# Patient Record
Sex: Female | Born: 1995 | Race: Black or African American | Hispanic: No | Marital: Single | Smoking: Never smoker
Health system: Southern US, Community
[De-identification: ages and names within clinical notes are randomized; demographics above are authoritative.]

## PROBLEM LIST (undated history)

## (undated) DIAGNOSIS — J45909 Unspecified asthma, uncomplicated: Secondary | ICD-10-CM

## (undated) HISTORY — PX: NO PAST SURGERIES: SHX2092

---
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
## 2017-02-10 ENCOUNTER — Encounter (HOSPITAL_COMMUNITY): Payer: Self-pay | Admitting: Emergency Medicine

## 2017-02-10 ENCOUNTER — Emergency Department (HOSPITAL_COMMUNITY): Payer: Self-pay

## 2017-02-10 ENCOUNTER — Emergency Department (HOSPITAL_COMMUNITY)
Admission: EM | Admit: 2017-02-10 | Discharge: 2017-02-10 | Disposition: A | Discharge status: Home / Self Care | Payer: Self-pay | Attending: Emergency Medicine | Admitting: Emergency Medicine

## 2017-02-10 DIAGNOSIS — B9789 Other viral agents as the cause of diseases classified elsewhere: Secondary | ICD-10-CM | POA: Insufficient documentation

## 2017-02-10 DIAGNOSIS — J45901 Unspecified asthma with (acute) exacerbation: Secondary | ICD-10-CM | POA: Insufficient documentation

## 2017-02-10 DIAGNOSIS — Z7722 Contact with and (suspected) exposure to environmental tobacco smoke (acute) (chronic): Secondary | ICD-10-CM | POA: Insufficient documentation

## 2017-02-10 DIAGNOSIS — J069 Acute upper respiratory infection, unspecified: Secondary | ICD-10-CM | POA: Insufficient documentation

## 2017-02-10 DIAGNOSIS — Z79899 Other long term (current) drug therapy: Secondary | ICD-10-CM | POA: Insufficient documentation

## 2017-02-10 DIAGNOSIS — R05 Cough: Secondary | ICD-10-CM | POA: Insufficient documentation

## 2017-02-10 MED ORDER — ALBUTEROL SULFATE (2.5 MG/3ML) 0.083% IN NEBU
INHALATION_SOLUTION | RESPIRATORY_TRACT | Status: AC
Start: 2017-02-10 — End: 2017-02-10
  Administered 2017-02-10: 13:00:00
  Filled 2017-02-10: qty 6

## 2017-02-10 MED ORDER — ALBUTEROL SULFATE (2.5 MG/3ML) 0.083% IN NEBU
5.0000 mg | INHALATION_SOLUTION | Freq: Once | RESPIRATORY_TRACT | Status: AC
  Administered 2017-02-10: 5 mg via RESPIRATORY_TRACT

## 2017-02-10 MED ORDER — BENZONATATE 100 MG PO CAPS: 100.0000 mg | ORAL_CAPSULE | Freq: Three times a day (TID) | ORAL | 0 refills | Status: DC

## 2017-02-10 MED ORDER — PREDNISONE 50 MG PO TABS: 50.0000 mg | ORAL_TABLET | Freq: Every day | ORAL | 0 refills | Status: DC

## 2017-02-10 NOTE — ED Triage Notes (Signed)
Pt arrives form home reporting ongoing URI x 2 weeks, hx asthma with SOB unrelieved by ned and inhaler at home.  Resp e/u, NAD noted at this time.

## 2017-02-10 NOTE — Discharge Instructions (Signed)
Please read attached information. If you experience any new or worsening signs or symptoms please return to the emergency room for evaluation. Please follow-up with your primary care provider or specialist as discussed. Please use medication prescribed only as directed and discontinue taking if you have any concerning signs or symptoms.   °

## 2017-02-10 NOTE — ED Provider Notes (Signed)
[facility] EMERGENCY DEPARTMENT Provider Note   CSN: [id number] Arrival date & time: 02/10/17  1204     History   Chief Complaint Chief Complaint  Patient presents with  . Shortness of Breath  . URI    HPI Cindy Welch is a 21 y.o. female.  HPI   21 year old female presents today with complaints of upper respiratory infection and asthma exacerbation.  Patient reports approximately 2 weeks ago she developed rhinorrhea, congestion, cough and wheezing.  She notes a history of asthma, has been using nebulizer and inhaler at home with minor improvements of her symptoms.  Patient notes she continues to have cough, wheezing not significantly improved with breathing treatments.  Patient denies any fever throughout the entire episode.  No other complaints today. Pt reports that she is not pregnant.  Past Medical History:  Diagnosis Date  . Asthma     There are no active problems to display for this patient.   History reviewed. No pertinent surgical history.  OB History    No data available       Home Medications    Prior to Admission medications   Medication Sig Start Date End Date Taking? Authorizing Provider  benzonatate (TESSALON) 100 MG capsule Take 1 capsule (100 mg total) by mouth every 8 (eight) hours. 02/10/17   Breezy Hertenstein, Tinnie Gens, PA-C  butalbital-acetaminophen-caffeine (FIORICET, ESGIC) [phone] MG tablet Take 1-2 tablets by mouth every 6 (six) hours as needed for headache. 08/03/16 08/03/17  Dorena Bodo, NP  cetirizine (ZYRTEC) 10 MG tablet Take 10 mg by mouth daily.    [provider]  ipratropium (ATROVENT) 0.06 % nasal spray Place 2 sprays into both nostrils 4 (four) times daily. 08/03/16   Dorena Bodo, NP  predniSONE (DELTASONE) 50 MG tablet Take 1 tablet (50 mg total) by mouth daily. 02/10/17   Eyvonne Mechanic, PA-C    Family History No family history on file.  Social History Social History  Substance Use Topics    . Smoking status: Passive Smoke Exposure - Never Smoker  . Smokeless tobacco: Not on file  . Alcohol use No     Allergies   Patient has no known allergies.   Review of Systems Review of Systems  All other systems reviewed and are negative.    Physical Exam Updated Vital Signs BP 115/69 (BP Location: Right Arm)   Pulse 98   Temp 99.2 F (37.3 C) (Oral)   Resp 18   LMP 01/20/2017 (Exact Date)   SpO2 98%   Physical Exam  Constitutional: She is oriented to person, place, and time. She appears well-developed and well-nourished.  HENT:  Head: Normocephalic and atraumatic.  Rhinorrhea  Eyes: Pupils are equal, round, and reactive to light. Conjunctivae are normal. Right eye exhibits no discharge. Left eye exhibits no discharge. No scleral icterus.  Neck: Normal range of motion. No JVD present. No tracheal deviation present.  Cardiovascular: Regular rhythm, normal heart sounds and intact distal pulses.  Exam reveals no gallop and no friction rub.   No murmur heard. Pulmonary/Chest: Effort normal and breath sounds normal. No stridor. No respiratory distress. She has no wheezes. She has no rales. She exhibits no tenderness.  Neurological: She is alert and oriented to person, place, and time. Coordination normal.  Skin: Skin is warm.  Psychiatric: She has a normal mood and affect. Her behavior is normal. Judgment and thought content normal.  Nursing note and vitals reviewed.    ED Treatments / Results  Labs (  all labs ordered are listed, but only abnormal results are displayed) Labs Reviewed - No data to display  EKG  EKG Interpretation None       Radiology Dg Chest 2 View  Result Date: 02/10/2017 CLINICAL DATA:  Shortness breath. Cough for 2 weeks. History of asthma. EXAM: CHEST  2 VIEW COMPARISON:  Chest x-ray dated November 10, 2012. FINDINGS: The heart size and mediastinal contours are within normal limits. Both lungs are clear. The visualized skeletal structures are  unremarkable. IMPRESSION: Normal chest x-ray. Electronically Signed   By: Obie DredgeWilliam T Derry M.D.   On: 02/10/2017 13:10    Procedures Procedures (including critical care time)  Medications Ordered in ED Medications  albuterol (PROVENTIL) (2.5 MG/3ML) 0.083% nebulizer solution 5 mg (5 mg Nebulization Given 02/10/17 1228)  albuterol (PROVENTIL) (2.5 MG/3ML) 0.083% nebulizer solution (  Given 02/10/17 1257)     Initial Impression / Assessment and Plan / ED Course  I have reviewed the triage vital signs and the nursing notes.  Pertinent labs & imaging results that were available during my care of the patient were reviewed by me and considered in my medical decision making (see chart for details).      Final Clinical Impressions(s) / ED Diagnoses   Final diagnoses:  Mild asthma with exacerbation, unspecified whether persistent  Viral URI with cough    Labs:   Imaging: DG Chest 2 View  Consults:  Therapeutics: DuoNeb  Discharge Meds: Prednisone, Tessalon Perles  Assessment/Plan: 21 year old female presents today with likely asthma exacerbation secondary to viral upper respiratory infection.  She is afebrile nontoxic.  She has clear lung sounds after breathing treatment here.  Patient describes recurring asthma exacerbations.  She will be started on a short course of steroids, she will follow-up with her primary care if symptoms do not improve, return immediately if they worsen.  She verbalized understanding and agreement to today's plan had no further questions or concerns the time discharge.      New Prescriptions New Prescriptions   BENZONATATE (TESSALON) 100 MG CAPSULE    Take 1 capsule (100 mg total) by mouth every 8 (eight) hours.   PREDNISONE (DELTASONE) 50 MG TABLET    Take 1 tablet (50 mg total) by mouth daily.     Eyvonne MechanicHedges, Ersie Savino, PA-C 02/10/17 1328    Eyvonne MechanicHedges, Dontrez Pettis, PA-C 02/10/17 1329    Rolland PorterJames, Mark, MD 02/22/17 [phone]

## 2017-05-24 ENCOUNTER — Emergency Department (HOSPITAL_COMMUNITY)
Admission: EM | Admit: 2017-05-24 | Discharge: 2017-05-24 | Disposition: A | Discharge status: Home / Self Care | Payer: Medicaid Other | Attending: Emergency Medicine | Admitting: Emergency Medicine

## 2017-05-24 ENCOUNTER — Encounter (HOSPITAL_COMMUNITY): Payer: Self-pay | Admitting: Emergency Medicine

## 2017-05-24 ENCOUNTER — Emergency Department (HOSPITAL_COMMUNITY): Payer: Medicaid Other

## 2017-05-24 ENCOUNTER — Other Ambulatory Visit: Payer: Self-pay

## 2017-05-24 DIAGNOSIS — Z7722 Contact with and (suspected) exposure to environmental tobacco smoke (acute) (chronic): Secondary | ICD-10-CM | POA: Insufficient documentation

## 2017-05-24 DIAGNOSIS — J40 Bronchitis, not specified as acute or chronic: Secondary | ICD-10-CM | POA: Insufficient documentation

## 2017-05-24 LAB — INFLUENZA PANEL BY PCR (TYPE A & B)
INFLAPCR: NEGATIVE
INFLBPCR: NEGATIVE

## 2017-05-24 MED ORDER — ALBUTEROL SULFATE HFA 108 (90 BASE) MCG/ACT IN AERS: 2.0000 | INHALATION_SPRAY | RESPIRATORY_TRACT | 0 refills | Status: DC | PRN

## 2017-05-24 MED ORDER — PREDNISONE 20 MG PO TABS: ORAL_TABLET | ORAL | 0 refills | Status: DC

## 2017-05-24 MED ORDER — AZITHROMYCIN 250 MG PO TABS: ORAL_TABLET | ORAL | 0 refills | Status: DC

## 2017-05-24 MED ORDER — DM-GUAIFENESIN ER 30-600 MG PO TB12
1.0000 | ORAL_TABLET | Freq: Two times a day (BID) | ORAL | Status: DC
  Administered 2017-05-24: 1 via ORAL
  Filled 2017-05-24: qty 1

## 2017-05-24 NOTE — ED Notes (Signed)
C/o fever chills nausea and cough x 2 days.

## 2017-05-24 NOTE — Discharge Instructions (Signed)
Drink plenty of fluids. Take mucinex DM OTC for cough. Use your inhaler or nebulizer for wheezing as needed. Take the prednisone and zpak until gone. Recheck if you struggle to breathe despite using your inhalers.

## 2017-05-24 NOTE — ED Provider Notes (Signed)
[facility] Provider Note   CSN: [id number] Arrival date & time: 05/24/17  0222  Time seen 02:59 AM   History   Chief Complaint Chief Complaint  Patient presents with  . Flu like S/S    HPI Johna RolesMaria T Howser is a 22 y.o. female.  HPI patient reports about 2 days ago she started having with some yellow sputum production, rhinorrhea that is clear without sneezing, chills, but no documented fevers at home.  She has had nausea and vomited twice today, she denies diarrhea.  She started getting wheezing on February 9 and used her nebulizer at 1230 this morning.  She currently does not feel like she is wheezing.  She denies sore throat.  She states her chest hurts a little bit in the center after coughing.  She denies abdominal pain.  She states her tongue feels dry.  She states she feels short of breath but she does not feel like she is wheezing now.  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  PCP Dossie ArbourJennings, Jessica, MD   Past Medical History:  Diagnosis Date  . Asthma     There are no active problems to display for this patient.   History reviewed. No pertinent surgical history.  OB History    No data available       Home Medications    Prior to Admission medications   Medication Sig Start Date End Date Taking? Authorizing Provider  albuterol (PROVENTIL HFA;VENTOLIN HFA) 108 (90 Base) MCG/ACT inhaler Inhale 1-2 puffs into the lungs every 6 (six) hours as needed for wheezing or shortness of breath.   Yes [provider]  albuterol (PROVENTIL HFA;VENTOLIN HFA) 108 (90 Base) MCG/ACT inhaler Inhale 2 puffs into the lungs every 4 (four) hours as needed for wheezing or shortness of breath. 05/24/17   Devoria AlbeKnapp, Kennedi Lizardo, MD  azithromycin (ZITHROMAX Z-PAK) 250 MG tablet Take 2 po the first day then once a day for the next 4 days. 05/24/17   Devoria AlbeKnapp, Serah Nicoletti, MD  benzonatate (TESSALON) 100 MG  capsule Take 1 capsule (100 mg total) by mouth every 8 (eight) hours. Patient not taking: Reported on 05/24/2017 02/10/17   Hedges, Tinnie GensJeffrey, PA-C  butalbital-acetaminophen-caffeine (FIORICET, ESGIC) [phone] MG tablet Take 1-2 tablets by mouth every 6 (six) hours as needed for headache. Patient not taking: Reported on 05/24/2017 08/03/16 08/03/17  Dorena BodoKennard, Lawrence, NP  ipratropium (ATROVENT) 0.06 % nasal spray Place 2 sprays into both nostrils 4 (four) times daily. Patient not taking: Reported on 05/24/2017 08/03/16   Dorena BodoKennard, Lawrence, NP  predniSONE (DELTASONE) 20 MG tablet Take 3 po QD x 3d , then 2 po QD x 3d then 1 po QD x 3d 05/24/17   Devoria AlbeKnapp, Branda Chaudhary, MD    Family History No family history on file.  Social History Social History   Tobacco Use  . Smoking status: Passive Smoke Exposure - Never Smoker  . Smokeless tobacco: Never Used  Substance Use Topics  . Alcohol use: No  . Drug use: No  employed   Allergies   Patient has no known allergies.   Review of Systems Review of Systems  All other systems reviewed and are negative.    Physical Exam Updated Vital Signs BP 137/78 (BP Location: Right Arm)   Pulse (!) 118   Temp 100 F (37.8 C) (Oral)   Resp 18   Ht 5\' 4"  (1.626 m)  Wt 81.6 kg (180 lb)   SpO2 99%   BMI 30.90 kg/m   Vital signs normal except for low-grade temp and tachycardia   Physical Exam  Constitutional: She is oriented to person, place, and time. She appears well-developed and well-nourished.  Non-toxic appearance. She does not appear ill. No distress.  Looks like she feels bad  HENT:  Head: Normocephalic and atraumatic.  Right Ear: External ear normal.  Left Ear: External ear normal.  Nose: Nose normal. No mucosal edema or rhinorrhea.  Mouth/Throat: Oropharynx is clear and moist and mucous membranes are normal. No dental abscesses or uvula swelling.  Eyes: Conjunctivae and EOM are normal. Pupils are equal, round, and reactive to light.  Neck: Normal  range of motion and full passive range of motion without pain. Neck supple.  Cardiovascular: Normal rate, regular rhythm and normal heart sounds. Exam reveals no gallop and no friction rub.  No murmur heard. Pulmonary/Chest: Effort normal and breath sounds normal. No respiratory distress. She has no wheezes. She has no rhonchi. She has no rales. She exhibits no tenderness and no crepitus.  Abdominal: Soft. Normal appearance and bowel sounds are normal. She exhibits no distension. There is no tenderness. There is no rebound and no guarding.  Musculoskeletal: Normal range of motion. She exhibits no edema or tenderness.  Moves all extremities well.   Neurological: She is alert and oriented to person, place, and time. She has normal strength. No cranial nerve deficit.  Skin: Skin is warm, dry and intact. No rash noted. No erythema. No pallor.  Psychiatric: She has a normal mood and affect. Her speech is normal and behavior is normal. Her mood appears not anxious.  Nursing note and vitals reviewed.    ED Treatments / Results  Labs (all labs ordered are listed, but only abnormal results are displayed) Results for orders placed or performed during the hospital encounter of 05/24/17  Influenza panel by PCR (type A & B)  Result Value Ref Range   Influenza A By PCR NEGATIVE NEGATIVE   Influenza B By PCR NEGATIVE NEGATIVE   Laboratory interpretation all normal    EKG  EKG Interpretation None       Radiology Dg Chest 2 View  Result Date: 05/24/2017 CLINICAL DATA:  Flu like symptoms x2 days. EXAM: CHEST  2 VIEW COMPARISON:  02/10/2017 FINDINGS: The heart size and mediastinal contours are within normal limits. Both lungs are clear. The visualized skeletal structures are unremarkable. IMPRESSION: No active cardiopulmonary disease. Electronically Signed   By: Tollie Eth M.D.   On: 05/24/2017 03:07    Procedures Procedures (including critical care time)  Medications Ordered in  ED Medications  dextromethorphan-guaiFENesin (MUCINEX DM) 30-600 MG per 12 hr tablet 1 tablet (1 tablet Oral Given 05/24/17 0407)     Initial Impression / Assessment and Plan / ED Course  I have reviewed the triage vital signs and the nursing notes.  Pertinent labs & imaging results that were available during my care of the patient were reviewed by me and considered in my medical decision making (see chart for details).    Influenza testing was done.  At the time of my exam patient was not wheezing.  She was given Mucinex for her cough.  Recheck at 4:45 AM at time of discharge.  I gave the test results to patient and her mother.  When I listen to her lungs she has good air movement and she is not wheezing.  We discussed that she normally  does get put on steroids when she has an asthma flareup.  She can take Mucinex DM over-the-counter for cough.  I am going to put her on antibiotic due to her underlying asthma and her fever.  Even though her chest x-ray did not show pneumonia she has probably a bronchitis and asthma flare.   Final Clinical Impressions(s) / ED Diagnoses   Final diagnoses:  Bronchitis    ED Discharge Orders        Ordered    albuterol (PROVENTIL HFA;VENTOLIN HFA) 108 (90 Base) MCG/ACT inhaler  Every 4 hours PRN     05/24/17 0511    predniSONE (DELTASONE) 20 MG tablet     05/24/17 0511    azithromycin (ZITHROMAX Z-PAK) 250 MG tablet     05/24/17 6045      Plan discharge  Devoria Albe, MD, Concha Pyo, MD 05/24/17 [phone]

## 2017-05-24 NOTE — ED Triage Notes (Signed)
Pt reports flu like S/S X2 days. Cough, sore throat, fever/chills, body aches, emesis. Pt has not tried any OTC meds.

## 2018-05-05 ENCOUNTER — Encounter (HOSPITAL_COMMUNITY): Payer: Self-pay | Admitting: Emergency Medicine

## 2018-05-05 ENCOUNTER — Emergency Department (HOSPITAL_COMMUNITY)
Admission: EM | Admit: 2018-05-05 | Discharge: 2018-05-05 | Disposition: A | Discharge status: Home / Self Care | Payer: Self-pay | Attending: Emergency Medicine | Admitting: Emergency Medicine

## 2018-05-05 DIAGNOSIS — J45909 Unspecified asthma, uncomplicated: Secondary | ICD-10-CM | POA: Insufficient documentation

## 2018-05-05 DIAGNOSIS — R112 Nausea with vomiting, unspecified: Secondary | ICD-10-CM | POA: Insufficient documentation

## 2018-05-05 DIAGNOSIS — Z79899 Other long term (current) drug therapy: Secondary | ICD-10-CM | POA: Insufficient documentation

## 2018-05-05 DIAGNOSIS — R197 Diarrhea, unspecified: Secondary | ICD-10-CM | POA: Insufficient documentation

## 2018-05-05 DIAGNOSIS — Z7722 Contact with and (suspected) exposure to environmental tobacco smoke (acute) (chronic): Secondary | ICD-10-CM | POA: Insufficient documentation

## 2018-05-05 LAB — URINALYSIS, ROUTINE W REFLEX MICROSCOPIC
Bilirubin Urine: NEGATIVE
GLUCOSE, UA: NEGATIVE mg/dL
KETONES UR: 80 mg/dL — AB
Leukocytes, UA: NEGATIVE
Nitrite: NEGATIVE
PH: 7 (ref 5.0–8.0)
Protein, ur: NEGATIVE mg/dL
SPECIFIC GRAVITY, URINE: 1.025 (ref 1.005–1.030)

## 2018-05-05 LAB — COMPREHENSIVE METABOLIC PANEL
ALT: 17 U/L (ref 0–44)
ANION GAP: 9 (ref 5–15)
AST: 20 U/L (ref 15–41)
Albumin: 4 g/dL (ref 3.5–5.0)
Alkaline Phosphatase: 44 U/L (ref 38–126)
BILIRUBIN TOTAL: 1.1 mg/dL (ref 0.3–1.2)
BUN: 12 mg/dL (ref 6–20)
CO2: 21 mmol/L — ABNORMAL LOW (ref 22–32)
Calcium: 9.3 mg/dL (ref 8.9–10.3)
Chloride: 108 mmol/L (ref 98–111)
Creatinine, Ser: 0.62 mg/dL (ref 0.44–1.00)
Glucose, Bld: 111 mg/dL — ABNORMAL HIGH (ref 70–99)
POTASSIUM: 3.5 mmol/L (ref 3.5–5.1)
Sodium: 138 mmol/L (ref 135–145)
TOTAL PROTEIN: 7.7 g/dL (ref 6.5–8.1)

## 2018-05-05 LAB — CBC WITH DIFFERENTIAL/PLATELET
ABS IMMATURE GRANULOCYTES: 0.03 10*3/uL (ref 0.00–0.07)
BASOS PCT: 0 %
Basophils Absolute: 0 10*3/uL (ref 0.0–0.1)
EOS ABS: 0 10*3/uL (ref 0.0–0.5)
Eosinophils Relative: 0 %
HCT: 38 % (ref 36.0–46.0)
Hemoglobin: 12 g/dL (ref 12.0–15.0)
IMMATURE GRANULOCYTES: 0 %
Lymphocytes Relative: 5 %
Lymphs Abs: 0.4 10*3/uL — ABNORMAL LOW (ref 0.7–4.0)
MCH: 31.1 pg (ref 26.0–34.0)
MCHC: 31.6 g/dL (ref 30.0–36.0)
MCV: 98.4 fL (ref 80.0–100.0)
Monocytes Absolute: 0.3 10*3/uL (ref 0.1–1.0)
Monocytes Relative: 4 %
NEUTROS ABS: 7.5 10*3/uL (ref 1.7–7.7)
NEUTROS PCT: 91 %
PLATELETS: 214 10*3/uL (ref 150–400)
RBC: 3.86 MIL/uL — AB (ref 3.87–5.11)
RDW: 12.2 % (ref 11.5–15.5)
WBC: 8.2 10*3/uL (ref 4.0–10.5)
nRBC: 0 % (ref 0.0–0.2)

## 2018-05-05 LAB — POC URINE PREG, ED: Preg Test, Ur: NEGATIVE

## 2018-05-05 MED ORDER — DIPHENHYDRAMINE HCL 50 MG/ML IJ SOLN
25.0000 mg | Freq: Once | INTRAMUSCULAR | Status: AC
  Administered 2018-05-05: 25 mg via INTRAVENOUS
  Filled 2018-05-05: qty 1

## 2018-05-05 MED ORDER — ONDANSETRON 4 MG PO TBDP: 4.0000 mg | ORAL_TABLET | Freq: Three times a day (TID) | ORAL | 0 refills | Status: DC | PRN

## 2018-05-05 MED ORDER — LACTATED RINGERS IV BOLUS
1000.0000 mL | Freq: Once | INTRAVENOUS | Status: AC
  Administered 2018-05-05: 1000 mL via INTRAVENOUS

## 2018-05-05 MED ORDER — FAMOTIDINE IN NACL 20-0.9 MG/50ML-% IV SOLN
20.0000 mg | Freq: Once | INTRAVENOUS | Status: AC
  Administered 2018-05-05: 20 mg via INTRAVENOUS
  Filled 2018-05-05: qty 50

## 2018-05-05 MED ORDER — METOCLOPRAMIDE HCL 5 MG/ML IJ SOLN
10.0000 mg | Freq: Once | INTRAMUSCULAR | Status: AC
  Administered 2018-05-05: 10 mg via INTRAVENOUS
  Filled 2018-05-05: qty 2

## 2018-05-05 NOTE — Discharge Instructions (Addendum)
Please read and follow all provided instructions.  Your diagnoses today include:  1. Nausea vomiting and diarrhea     Tests performed today include: Blood counts and electrolytes Blood tests to check liver and kidney function Blood tests to check pancreas function Urine test to look for infection and pregnancy (in women) Vital signs. See below for your results today.   Medications prescribed:   Take any prescribed medications only as directed.  You may take Zofran under the tongue every 8 hours as needed for nausea and vomiting.  Home care instructions:  Follow any educational materials contained in this packet.  Your abdominal pain, nausea, vomiting, and diarrhea may be caused by a viral gastroenteritis also called 'stomach flu'. You should rest for the next several days. Keep drinking plenty of fluids and use the medicine for nausea as directed.   Drink clear liquids for the next 24 hours and introduce solid foods slowly after 24 hours using the b.r.a.t. diet (Bananas, Rice, Applesauce, Toast, Yogurt).    Follow-up instructions: Please follow-up with your primary care provider in the next 2 days for further evaluation of your symptoms. If you are not feeling better in 48 hours you may have a condition that is more serious and you need re-evaluation.   Return instructions:  SEEK IMMEDIATE MEDICAL ATTENTION IF: If you have pain that does not go away or becomes severe  A temperature above 101F develops  Repeated vomiting occurs (multiple episodes)  If you have pain that becomes localized to portions of the abdomen. The right side could possibly be appendicitis. In an adult, the left lower portion of the abdomen could be colitis or diverticulitis.  Blood is being passed in stools or vomit (bright red or black tarry stools)  You develop chest pain, difficulty breathing, dizziness or fainting, or become confused, poorly responsive, or inconsolable (young children) If you have any other  emergent concerns regarding your health  Additional Information: Abdominal (belly) pain can be caused by many things. Your caregiver performed an examination and possibly ordered blood/urine tests and imaging (CT scan, x-rays, ultrasound). Many cases can be observed and treated at home after initial evaluation in the emergency department. Even though you are being discharged home, abdominal pain can be unpredictable. Therefore, you need a repeated exam if your pain does not resolve, returns, or worsens. Most patients with abdominal pain don't have to be admitted to the hospital or have surgery, but serious problems like appendicitis and gallbladder attacks can start out as nonspecific pain. Many abdominal conditions cannot be diagnosed in one visit, so follow-up evaluations are very important.  Your vital signs today were: BP 124/74    Pulse 99    Temp 98.4 F (36.9 C) (Oral)    Resp 16    Ht 5\' 4"  (1.626 m)    Wt 77.1 kg    LMP 04/13/2018    SpO2 95%    BMI 29.18 kg/m  If your blood pressure (bp) was elevated above 135/85 this visit, please have this repeated by your doctor within one month. --------------

## 2018-05-05 NOTE — ED Provider Notes (Signed)
[facility] EMERGENCY DEPARTMENT Provider Note   CSN: [id number] Arrival date & time: 05/05/18  0847     History   Chief Complaint Chief Complaint  Patient presents with  . Emesis  . Diarrhea    HPI Cindy Welch is a 23 y.o. female.  HPI  Patient is a 23 year old female with a history of asthma presenting for nausea, vomiting, and diarrhea.  Patient ports her symptoms began at 3 AM.  She reports that she woke up with her symptoms.  She reports that she vomited approximately 10 times and had approximately 4 times diarrhea since this time.  She reports that she has not had any bilious or bloody vomiting.  No melena or hematochezia.  Patient reports that she was feeling well yesterday.  Denies fever.  Last menstrual period at the beginning of last month, but she reports she is not sure she could be pregnant.  No recent sick contacts.  She denies any abdominal pain.  Denies dysuria, urgency, frequency, or flank pain.  No vaginal discharge.  No chest pain or shortness of breath.  Patient denies any congestion, rhinorrhea or sore throat.  No cough.  Denies remedies prior to arrival.  Past Medical History:  Diagnosis Date  . Asthma     There are no active problems to display for this patient.   History reviewed. No pertinent surgical history.   OB History   No obstetric history on file.      Home Medications    Prior to Admission medications   Medication Sig Start Date End Date Taking? Authorizing Provider  albuterol (PROVENTIL HFA;VENTOLIN HFA) 108 (90 Base) MCG/ACT inhaler Inhale 1-2 puffs into the lungs every 6 (six) hours as needed for wheezing or shortness of breath.    [provider]  albuterol (PROVENTIL HFA;VENTOLIN HFA) 108 (90 Base) MCG/ACT inhaler Inhale 2 puffs into the lungs every 4 (four) hours as needed for wheezing or shortness of breath. 05/24/17   Devoria Albe, MD  azithromycin (ZITHROMAX Z-PAK) 250 MG tablet Take 2 po the first  day then once a day for the next 4 days. 05/24/17   Devoria Albe, MD  benzonatate (TESSALON) 100 MG capsule Take 1 capsule (100 mg total) by mouth every 8 (eight) hours. Patient not taking: Reported on 05/24/2017 02/10/17   Hedges, Tinnie Gens, PA-C  ipratropium (ATROVENT) 0.06 % nasal spray Place 2 sprays into both nostrils 4 (four) times daily. Patient not taking: Reported on 05/24/2017 08/03/16   Dorena Bodo, NP  predniSONE (DELTASONE) 20 MG tablet Take 3 po QD x 3d , then 2 po QD x 3d then 1 po QD x 3d 05/24/17   Devoria Albe, MD    Family History No family history on file.  Social History Social History   Tobacco Use  . Smoking status: Passive Smoke Exposure - Never Smoker  . Smokeless tobacco: Never Used  Substance Use Topics  . Alcohol use: No  . Drug use: No    Types: Marijuana     Allergies   Patient has no known allergies.   Review of Systems Review of Systems  Constitutional: Negative for chills and fever.  HENT: Negative for congestion and sore throat.   Eyes: Negative for visual disturbance.  Respiratory: Negative for cough, chest tightness and shortness of breath.   Cardiovascular: Negative for chest pain.  Gastrointestinal: Positive for diarrhea, nausea and vomiting. Negative for abdominal pain and constipation.  Genitourinary: Negative for dysuria, flank pain, frequency and  urgency.  Musculoskeletal: Negative for back pain and myalgias.  Skin: Negative for rash.  Neurological: Negative for dizziness and syncope.     Physical Exam Updated Vital Signs BP 124/74   Pulse 99   Temp 98.4 F (36.9 C) (Oral)   Resp 16   Ht 5\' 4"  (1.626 m)   Wt 77.1 kg   LMP 04/13/2018   SpO2 95%   BMI 29.18 kg/m   Physical Exam Vitals signs and nursing note reviewed.  Constitutional:      General: She is not in acute distress.    Appearance: She is well-developed.  HENT:     Head: Normocephalic and atraumatic.     Mouth/Throat:     Mouth: Mucous membranes are moist.    Eyes:     Conjunctiva/sclera: Conjunctivae normal.     Pupils: Pupils are equal, round, and reactive to light.  Neck:     Musculoskeletal: Normal range of motion and neck supple.  Cardiovascular:     Rate and Rhythm: Normal rate and regular rhythm.     Heart sounds: S1 normal and S2 normal. No murmur.  Pulmonary:     Effort: Pulmonary effort is normal.     Breath sounds: Normal breath sounds. No wheezing or rales.  Abdominal:     General: There is no distension.     Palpations: Abdomen is soft.     Tenderness: There is no abdominal tenderness. There is no guarding.  Musculoskeletal: Normal range of motion.        General: No deformity.  Lymphadenopathy:     Cervical: No cervical adenopathy.  Skin:    General: Skin is warm and dry.     Findings: No erythema or rash.  Neurological:     Mental Status: She is alert.     Comments: Cranial nerves grossly intact. Patient moves extremities symmetrically and with good coordination.  Psychiatric:        Behavior: Behavior normal.        Thought Content: Thought content normal.        Judgment: Judgment normal.      ED Treatments / Results  Labs (all labs ordered are listed, but only abnormal results are displayed) Labs Reviewed  COMPREHENSIVE METABOLIC PANEL - Abnormal; Notable for the following components:      Result Value   CO2 21 (*)    Glucose, Bld 111 (*)    All other components within normal limits  CBC WITH DIFFERENTIAL/PLATELET - Abnormal; Notable for the following components:   RBC 3.86 (*)    Lymphs Abs 0.4 (*)    All other components within normal limits  URINALYSIS, ROUTINE W REFLEX MICROSCOPIC - Abnormal; Notable for the following components:   APPearance HAZY (*)    Hgb urine dipstick SMALL (*)    Ketones, ur 80 (*)    Bacteria, UA RARE (*)    All other components within normal limits  POC URINE PREG, ED  POC URINE PREG, ED    EKG None  Radiology No results found.  Procedures Procedures  (including critical care time)  Medications Ordered in ED Medications  lactated ringers bolus 1,000 mL (1,000 mLs Intravenous New Bag/Given 05/05/18 0938)  metoCLOPramide (REGLAN) injection 10 mg (10 mg Intravenous Given 05/05/18 0934)  diphenhydrAMINE (BENADRYL) injection 25 mg (25 mg Intravenous Given 05/05/18 0933)  famotidine (PEPCID) IVPB 20 mg premix (0 mg Intravenous Stopped 05/05/18 1008)     Initial Impression / Assessment and Plan / ED Course  I have reviewed the triage vital signs and the nursing notes.  Pertinent labs & imaging results that were available during my care of the patient were reviewed by me and considered in my medical decision making (see chart for details).     Patient nontoxic-appearing, afebrile, hemodynamically stable and with benign abdomen.  Official diagnosis includes viral gastroenteritis, pyelonephritis, pregnancy.   Pregnancy test is negative.  Patient has no leukocytosis.  Patient has normal electrolytes.  Do not suspect acute intra-abdominal pathology.  Low suspicion for pyelonephritis given UA not concerning for infection.  Patient received 1 L of lactated Ringer's for fluid repletion.  Patient is tolerating p.o. in the emergency department after antiemetics.  Will discharge on antiemetics.  Patient can return precautions for any abdominal pain, fever or intractable nausea or vomiting.  Patient is in understanding and agrees with the plan of care.  Final Clinical Impressions(s) / ED Diagnoses   Final diagnoses:  Nausea vomiting and diarrhea    ED Discharge Orders         Ordered    ondansetron (ZOFRAN ODT) 4 MG disintegrating tablet  Every 8 hours PRN     05/05/18 1303           Elisha PonderMurray, Lilyrose Tanney B, [place]PA-C 05/05/18 1309    Tegeler, Canary Brimhristopher J, MD 05/05/18 [id number]

## 2018-05-05 NOTE — ED Triage Notes (Signed)
Pt arrives via EMS from home with reports of N/V/D that started around 3 am today. Reports vomiting about 10-11 times since this AM and going to the bathroom about 4 times. Denies any pain or being around anyone sick.

## 2018-05-05 NOTE — ED Notes (Signed)
Pt verbalized understanding of discharge paperwork, prescriptions and follow-up care 

## 2018-07-28 ENCOUNTER — Encounter (HOSPITAL_COMMUNITY): Payer: Self-pay | Admitting: Emergency Medicine

## 2018-07-28 ENCOUNTER — Emergency Department (HOSPITAL_COMMUNITY): Payer: Medicaid Other

## 2018-07-28 ENCOUNTER — Emergency Department (HOSPITAL_COMMUNITY)
Admission: EM | Admit: 2018-07-28 | Discharge: 2018-07-28 | Disposition: A | Discharge status: Home / Self Care | Payer: Medicaid Other | Attending: Emergency Medicine | Admitting: Emergency Medicine

## 2018-07-28 ENCOUNTER — Other Ambulatory Visit: Payer: Self-pay

## 2018-07-28 DIAGNOSIS — J45901 Unspecified asthma with (acute) exacerbation: Secondary | ICD-10-CM | POA: Insufficient documentation

## 2018-07-28 DIAGNOSIS — Z7722 Contact with and (suspected) exposure to environmental tobacco smoke (acute) (chronic): Secondary | ICD-10-CM | POA: Insufficient documentation

## 2018-07-28 DIAGNOSIS — F1290 Cannabis use, unspecified, uncomplicated: Secondary | ICD-10-CM | POA: Insufficient documentation

## 2018-07-28 DIAGNOSIS — R0789 Other chest pain: Secondary | ICD-10-CM | POA: Insufficient documentation

## 2018-07-28 DIAGNOSIS — R0981 Nasal congestion: Secondary | ICD-10-CM | POA: Insufficient documentation

## 2018-07-28 DIAGNOSIS — R42 Dizziness and giddiness: Secondary | ICD-10-CM | POA: Insufficient documentation

## 2018-07-28 IMAGING — DX PORTABLE CHEST - 1 VIEW
1 series · 1 of 1 positions shown · non-contrast
Comparison: Radiographs [DATE].

CLINICAL DATA: Shortness of breath.

EXAM:
PORTABLE CHEST 1 VIEW

[chest]
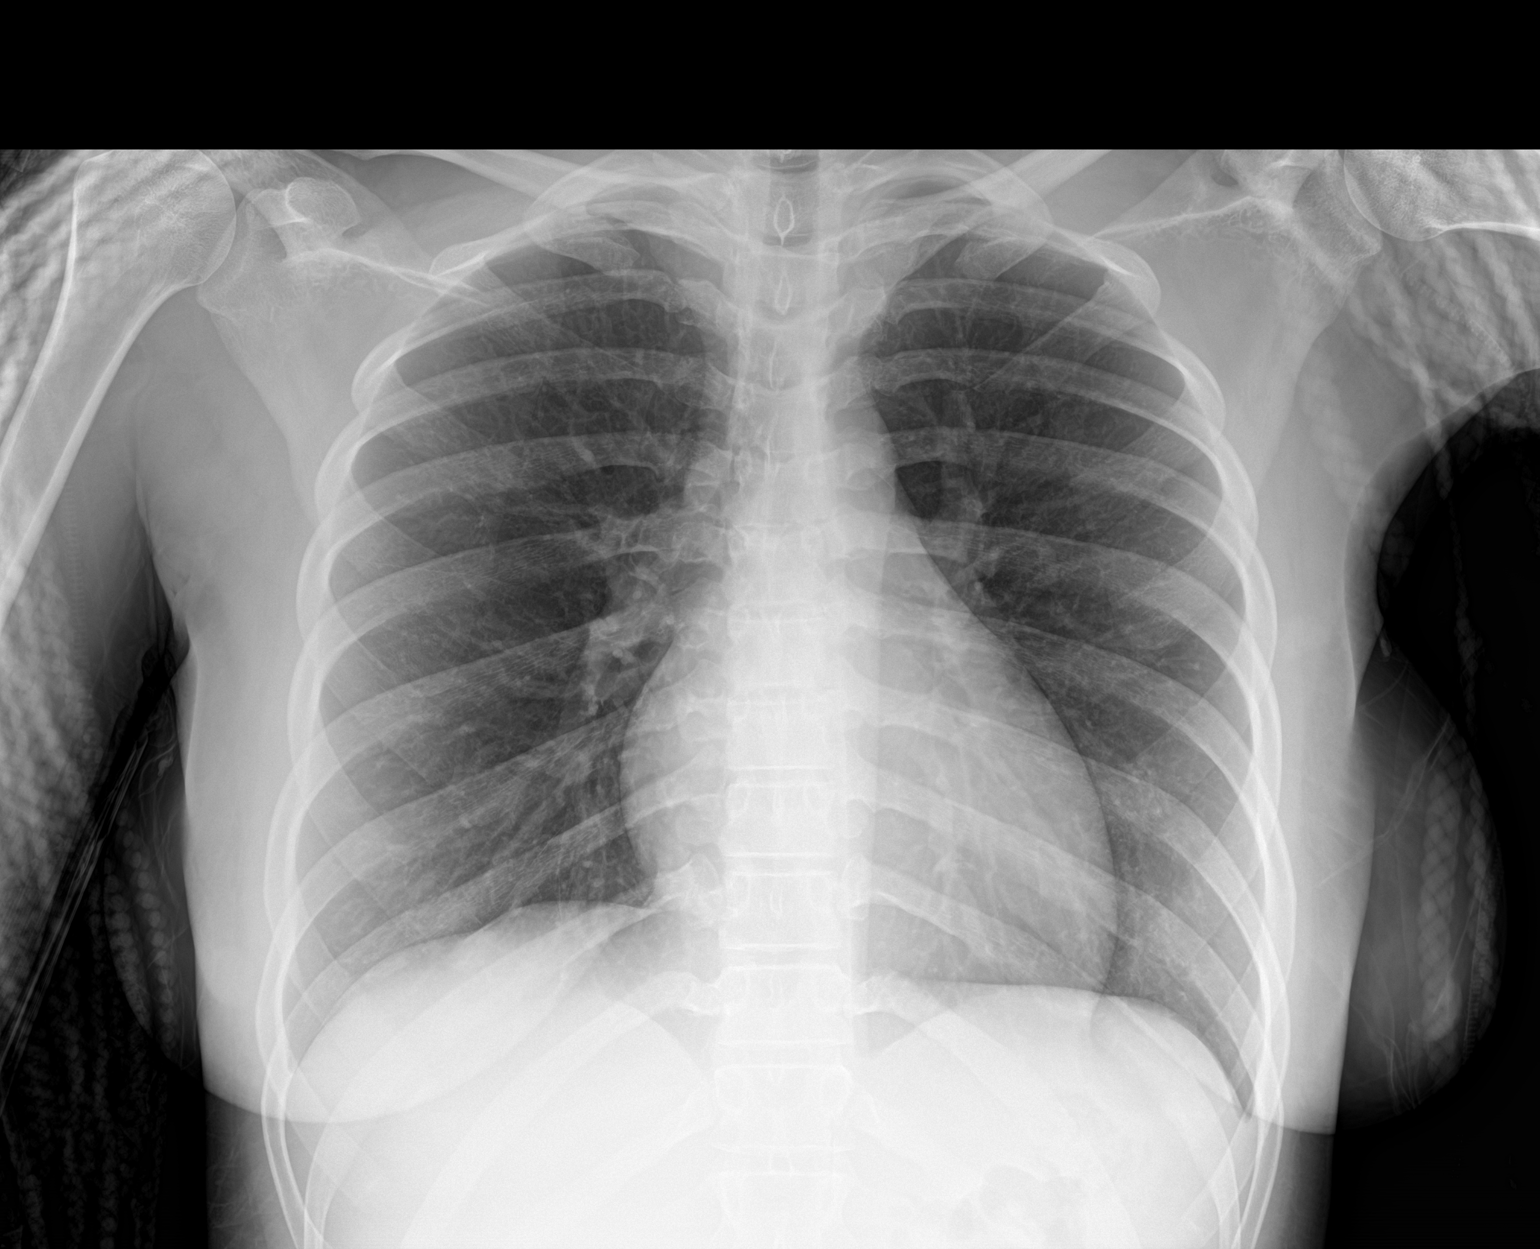

[1 of 1 positions shown; findings below may reference images not displayed]

FINDINGS: The heart size and mediastinal contours are within normal limits.
Both lungs are clear. No pneumothorax or pleural effusion is noted.
The visualized skeletal structures are unremarkable.
IMPRESSION: No active disease.

## 2018-07-28 MED ORDER — ALBUTEROL SULFATE HFA 108 (90 BASE) MCG/ACT IN AERS
1.0000 | INHALATION_SPRAY | Freq: Once | RESPIRATORY_TRACT | Status: AC
  Administered 2018-07-28: 08:00:00 2 via RESPIRATORY_TRACT
  Filled 2018-07-28: qty 6.7

## 2018-07-28 MED ORDER — PREDNISONE 20 MG PO TABS: 40.0000 mg | ORAL_TABLET | Freq: Every day | ORAL | 0 refills | Status: DC

## 2018-07-28 MED ORDER — BENZONATATE 100 MG PO CAPS: 100.0000 mg | ORAL_CAPSULE | Freq: Three times a day (TID) | ORAL | 0 refills | Status: DC

## 2018-07-28 MED ORDER — ALBUTEROL SULFATE (2.5 MG/3ML) 0.083% IN NEBU: 2.5000 mg | INHALATION_SOLUTION | Freq: Four times a day (QID) | RESPIRATORY_TRACT | 0 refills | Status: DC | PRN

## 2018-07-28 MED ORDER — PREDNISONE 20 MG PO TABS
60.0000 mg | ORAL_TABLET | Freq: Once | ORAL | Status: AC
  Administered 2018-07-28: 60 mg via ORAL
  Filled 2018-07-28: qty 3

## 2018-07-28 NOTE — ED Triage Notes (Signed)
  Patient BIB EMS for SOB that has been going on for 2 weeks.  Patient states she has hx of asthma and take albuterol but her neb machine is broken and her inhaler is empty.  Patient states she has had no fevers, or headache.  No exposure that she is aware of.  Patient is A&O x4.  Pain 7/10.

## 2018-07-28 NOTE — ED Provider Notes (Signed)
[facility] EMERGENCY DEPARTMENT Provider Note   CSN: [id number] Arrival date & time: 07/28/18  0701    History   Chief Complaint Chief Complaint  Patient presents with  . Shortness of Breath    HPI Cindy Welch is a 23 y.o. female who presents with SOB. PMH significant for asthma. She states that for the past 2 weeks she's had trouble with her breathing. It's been controllable because she's been using nebulizer treatments at home however the machine broke and her inhaler is empty. She reports associated wheezing, dry cough, runny nose. She denies fever, headache, sore throat. She reports substernal chest pain and right sided rib pain which is worse with coughing and movement. She is lightheaded with standing. She hasn't been eating or drinking because it's uncomfortable to move. No recent surgery/travel/immobilization, hx of cancer, leg swelling, hemoptysis, prior DVT/PE, or hormone use. No known sick contacts but she works at [employer].       HPI  Past Medical History:  Diagnosis Date  . Asthma     There are no active problems to display for this patient.   History reviewed. No pertinent surgical history.   OB History   No obstetric history on file.      Home Medications    Prior to Admission medications   Medication Sig Start Date End Date Taking? Authorizing Provider  albuterol (PROVENTIL HFA;VENTOLIN HFA) 108 (90 Base) MCG/ACT inhaler Inhale 1-2 puffs into the lungs every 6 (six) hours as needed for wheezing or shortness of breath.    [provider]  albuterol (PROVENTIL HFA;VENTOLIN HFA) 108 (90 Base) MCG/ACT inhaler Inhale 2 puffs into the lungs every 4 (four) hours as needed for wheezing or shortness of breath. 05/24/17   Devoria Albe, MD  azithromycin (ZITHROMAX Z-PAK) 250 MG tablet Take 2 po the first day then once a day for the next 4 days. 05/24/17   Devoria Albe, MD  benzonatate (TESSALON) 100 MG capsule Take 1 capsule (100 mg  total) by mouth every 8 (eight) hours. Patient not taking: Reported on 05/24/2017 02/10/17   Hedges, Tinnie Gens, PA-C  ipratropium (ATROVENT) 0.06 % nasal spray Place 2 sprays into both nostrils 4 (four) times daily. Patient not taking: Reported on 05/24/2017 08/03/16   Dorena Bodo, NP  ondansetron (ZOFRAN ODT) 4 MG disintegrating tablet Take 1 tablet (4 mg total) by mouth every 8 (eight) hours as needed for nausea or vomiting. 05/05/18   Aviva Kluver B, PA-C  predniSONE (DELTASONE) 20 MG tablet Take 3 po QD x 3d , then 2 po QD x 3d then 1 po QD x 3d 05/24/17   Devoria Albe, MD    Family History History reviewed. No pertinent family history.  Social History Social History   Tobacco Use  . Smoking status: Passive Smoke Exposure - Never Smoker  . Smokeless tobacco: Never Used  Substance Use Topics  . Alcohol use: No  . Drug use: No    Types: Marijuana     Allergies   Patient has no known allergies.   Review of Systems Review of Systems  Constitutional: Positive for appetite change. Negative for fever.  HENT: Positive for rhinorrhea.   Respiratory: Positive for cough, shortness of breath and wheezing.   Cardiovascular: Positive for chest pain. Negative for palpitations and leg swelling.  Neurological: Positive for light-headedness.     Physical Exam Updated Vital Signs BP 106/68   Pulse 81   Temp 99.1 F (37.3 C) (Oral)  Resp 18   Ht 5\' 4"  (1.626 m)   Wt 77.1 kg   SpO2 100%   BMI 29.18 kg/m   Physical Exam Vitals signs and nursing note reviewed.  Constitutional:      General: She is not in acute distress.    Appearance: She is well-developed. She is not ill-appearing.     Comments: Calm and comfortable. Texting on phone  HENT:     Head: Normocephalic and atraumatic.  Eyes:     General: No scleral icterus.       Right eye: No discharge.        Left eye: No discharge.     Conjunctiva/sclera: Conjunctivae normal.     Pupils: Pupils are equal, round, and  reactive to light.  Neck:     Musculoskeletal: Normal range of motion.  Cardiovascular:     Rate and Rhythm: Normal rate and regular rhythm.  Pulmonary:     Effort: Pulmonary effort is normal. No respiratory distress.     Breath sounds: Normal breath sounds.  Abdominal:     General: There is no distension.  Skin:    General: Skin is warm and dry.  Neurological:     Mental Status: She is alert and oriented to person, place, and time.  Psychiatric:        Behavior: Behavior normal.      ED Treatments / Results  Labs (all labs ordered are listed, but only abnormal results are displayed) Labs Reviewed - No data to display  EKG None  Radiology No results found.  Procedures Procedures (including critical care time)  Medications Ordered in ED Medications  albuterol (PROVENTIL HFA;VENTOLIN HFA) 108 (90 Base) MCG/ACT inhaler 1-2 puff (2 puffs Inhalation Given 07/28/18 0736)  predniSONE (DELTASONE) tablet 60 mg (60 mg Oral Given 07/28/18 0736)     Initial Impression / Assessment and Plan / ED Course  I have reviewed the triage vital signs and the nursing notes.  Pertinent labs & imaging results that were available during my care of the patient were reviewed by me and considered in my medical decision making (see chart for details).  23 year old female presents with SOB, wheezing, coughing and chest pain with coughing for the past several weeks. It has worsened since she's run out of meds and her nebulizer broke about 2 days ago. On exam she is calm and comfortable. Heart is regular rate and rhythm. Lungs are CTA. Vitals are normal. Will obtain CXR due to SOB and CP although sounds like asthma with chest wall pain from coughing. Will give albuterol inhaler and prednisone. Doubt COVID as she does not have a fever, sick contacts, or travel. Low suspicion for PE as well - she is PERC negative.  Cindy Welch was evaluated in Emergency Department on 07/28/2018 for the symptoms  described in the history of present illness. She was evaluated in the context of the global COVID-19 pandemic, which necessitated consideration that the patient might be at risk for infection with the SARS-CoV-2 virus that causes COVID-19. Institutional protocols and algorithms that pertain to the evaluation of patients at risk for COVID-19 are in a state of rapid change based on information released by regulatory bodies including the CDC and federal and state organizations. These policies and algorithms were followed during the patient's care in the ED.   Final Clinical Impressions(s) / ED Diagnoses   Final diagnoses:  Mild asthma with exacerbation, unspecified whether persistent    ED Discharge Orders  None       Bethel Born, PA-C 07/28/18 1751    Vanetta Mulders, MD 07/30/18 [phone]

## 2018-07-28 NOTE — ED Notes (Signed)
Patient verbalizes understanding of discharge instructions. Opportunity for questioning and answers were provided. Armband removed by staff, pt discharged from ED.  

## 2018-07-28 NOTE — Discharge Instructions (Signed)
Continue using inhaler as needed for shortness of breath/wheezing Take Prednisone for the next 5 days (steroid) Take Tessalon (cough medicine) as needed for cough Return if worsening

## 2018-12-08 ENCOUNTER — Encounter (HOSPITAL_COMMUNITY): Payer: Self-pay | Admitting: Emergency Medicine

## 2018-12-08 ENCOUNTER — Emergency Department (HOSPITAL_COMMUNITY): Payer: Self-pay

## 2018-12-08 ENCOUNTER — Emergency Department (HOSPITAL_COMMUNITY)
Admission: EM | Admit: 2018-12-08 | Discharge: 2018-12-09 | Disposition: A | Discharge status: Home / Self Care | Payer: Medicaid Other | Attending: Emergency Medicine | Admitting: Emergency Medicine

## 2018-12-08 ENCOUNTER — Other Ambulatory Visit: Payer: Self-pay

## 2018-12-08 ENCOUNTER — Emergency Department (HOSPITAL_COMMUNITY)
Admission: EM | Admit: 2018-12-08 | Discharge: 2018-12-08 | Disposition: A | Discharge status: Home / Self Care | Payer: Self-pay | Attending: Emergency Medicine | Admitting: Emergency Medicine

## 2018-12-08 DIAGNOSIS — R509 Fever, unspecified: Secondary | ICD-10-CM | POA: Insufficient documentation

## 2018-12-08 DIAGNOSIS — R05 Cough: Secondary | ICD-10-CM | POA: Insufficient documentation

## 2018-12-08 DIAGNOSIS — Z7722 Contact with and (suspected) exposure to environmental tobacco smoke (acute) (chronic): Secondary | ICD-10-CM | POA: Insufficient documentation

## 2018-12-08 DIAGNOSIS — J45909 Unspecified asthma, uncomplicated: Secondary | ICD-10-CM | POA: Insufficient documentation

## 2018-12-08 DIAGNOSIS — Z79899 Other long term (current) drug therapy: Secondary | ICD-10-CM | POA: Insufficient documentation

## 2018-12-08 DIAGNOSIS — J4521 Mild intermittent asthma with (acute) exacerbation: Secondary | ICD-10-CM | POA: Insufficient documentation

## 2018-12-08 DIAGNOSIS — E876 Hypokalemia: Secondary | ICD-10-CM

## 2018-12-08 DIAGNOSIS — R059 Cough, unspecified: Secondary | ICD-10-CM

## 2018-12-08 DIAGNOSIS — Z20828 Contact with and (suspected) exposure to other viral communicable diseases: Secondary | ICD-10-CM | POA: Insufficient documentation

## 2018-12-08 DIAGNOSIS — R5383 Other fatigue: Secondary | ICD-10-CM | POA: Insufficient documentation

## 2018-12-08 DIAGNOSIS — R062 Wheezing: Secondary | ICD-10-CM

## 2018-12-08 LAB — COMPREHENSIVE METABOLIC PANEL
ALT: 12 U/L (ref 0–44)
AST: 16 U/L (ref 15–41)
Albumin: 3.9 g/dL (ref 3.5–5.0)
Alkaline Phosphatase: 46 U/L (ref 38–126)
Anion gap: 9 (ref 5–15)
BUN: 6 mg/dL (ref 6–20)
CO2: 21 mmol/L — ABNORMAL LOW (ref 22–32)
Calcium: 9.2 mg/dL (ref 8.9–10.3)
Chloride: 107 mmol/L (ref 98–111)
Creatinine, Ser: 0.71 mg/dL (ref 0.44–1.00)
GFR calc Af Amer: >60 mL/min (ref >60)
GFR calc non Af Amer: >60 mL/min (ref >60)
Glucose, Bld: 134 mg/dL — ABNORMAL HIGH (ref 70–99)
Potassium: 2.9 mmol/L — ABNORMAL LOW (ref 3.5–5.1)
Sodium: 137 mmol/L (ref 135–145)
Total Bilirubin: 0.7 mg/dL (ref 0.3–1.2)
Total Protein: 7 g/dL (ref 6.5–8.1)

## 2018-12-08 LAB — CBC WITH DIFFERENTIAL/PLATELET
Abs Immature Granulocytes: 0.02 10*3/uL (ref 0.00–0.07)
Basophils Absolute: 0 10*3/uL (ref 0.0–0.1)
Basophils Relative: 0 %
Eosinophils Absolute: 0.3 10*3/uL (ref 0.0–0.5)
Eosinophils Relative: 4 %
HCT: 34.4 % — ABNORMAL LOW (ref 36.0–46.0)
Hemoglobin: 11.3 g/dL — ABNORMAL LOW (ref 12.0–15.0)
Immature Granulocytes: 0 %
Lymphocytes Relative: 19 %
Lymphs Abs: 1.4 10*3/uL (ref 0.7–4.0)
MCH: 33.4 pg (ref 26.0–34.0)
MCHC: 32.8 g/dL (ref 30.0–36.0)
MCV: 101.8 fL — ABNORMAL HIGH (ref 80.0–100.0)
Monocytes Absolute: 0.3 10*3/uL (ref 0.1–1.0)
Monocytes Relative: 4 %
Neutro Abs: 5 10*3/uL (ref 1.7–7.7)
Neutrophils Relative %: 73 %
Platelets: 221 10*3/uL (ref 150–400)
RBC: 3.38 MIL/uL — ABNORMAL LOW (ref 3.87–5.11)
RDW: 12.1 % (ref 11.5–15.5)
WBC: 7 10*3/uL (ref 4.0–10.5)
nRBC: 0 % (ref 0.0–0.2)

## 2018-12-08 LAB — I-STAT BETA HCG BLOOD, ED (MC, WL, AP ONLY): I-stat hCG, quantitative: <5 m[IU]/mL (ref <5)

## 2018-12-08 MED ORDER — AZITHROMYCIN 250 MG PO TABS
500.0000 mg | ORAL_TABLET | Freq: Once | ORAL | Status: AC
  Administered 2018-12-08: 19:00:00 500 mg via ORAL
  Filled 2018-12-08: qty 2

## 2018-12-08 MED ORDER — POTASSIUM CHLORIDE CRYS ER 20 MEQ PO TBCR
40.0000 meq | EXTENDED_RELEASE_TABLET | Freq: Once | ORAL | Status: AC
  Administered 2018-12-08: 19:00:00 40 meq via ORAL
  Filled 2018-12-08: qty 2

## 2018-12-08 MED ORDER — ACETAMINOPHEN 325 MG PO TABS
650.0000 mg | ORAL_TABLET | Freq: Once | ORAL | Status: AC
  Administered 2018-12-08: 19:00:00 650 mg via ORAL
  Filled 2018-12-08: qty 2

## 2018-12-08 MED ORDER — AZITHROMYCIN 250 MG PO TABS: 250.0000 mg | ORAL_TABLET | Freq: Every day | ORAL | 0 refills | Status: AC

## 2018-12-08 NOTE — ED Triage Notes (Signed)
Arrived via EMS from home. Patient developed intermittent productive cough clear to yellow sputum and fever 3 days ago. Symptoms continued today with fatigue. EMS took temporal temperature 102.8 F administered Tylenol 1000mg  PO prior to arrival.

## 2018-12-08 NOTE — Discharge Instructions (Addendum)
It was our pleasure to provide your ER care today - we hope that you feel better.  Take zithromax (antibiotic) as prescribed.  Take acetaminophen or ibuprofen as need for fever.   Your lab tests show your potassium level is low (2.9) - eat plenty of fruits and vegetables, and follow up with primary care doctor in the next 1-2 weeks.   Your covid test is pending and should result in the next 1-2 days. See precautions and additional covid information.   Return to ER if worse, new symptoms, increased trouble breathing, or other concern.

## 2018-12-08 NOTE — ED Triage Notes (Signed)
Pt arrives via [facility] for c/o sob. Pt was diagnosed here with bronchitis earlier this evening, given zpak but patient states she was unable to get it filled and is not feeling any better.

## 2018-12-08 NOTE — ED Provider Notes (Signed)
[facility] EMERGENCY DEPARTMENT Provider Note   CSN: [id number] Arrival date & time: 12/08/18  1641     History   Chief Complaint Chief Complaint  Patient presents with  . Cough  . Fever  . Fatigue    HPI Cindy Welch is a 23 y.o. female.     Patient c/o non productive cough, sore throat, body aches. Symptoms acute onset 3 days ago, moderate, persistent, felt worse today. No known covid+ exposure. +fever, 102. Mild sore throat, diffuse. No severe and/or unilateral throat pain or swelling. No known strep or mono exposure. Denies headache. No neck pain or stiffness. No chest pain or sob. No abd pain. No nvd. No dysuria or gu c/o. No rash/skin lesions.   The history is provided by the patient.  Cough Associated symptoms: fever and sore throat   Associated symptoms: no chest pain, no chills, no headaches, no rash and no shortness of breath   Fever Associated symptoms: congestion, cough and sore throat   Associated symptoms: no chest pain, no chills, no confusion, no diarrhea, no dysuria, no headaches, no rash and no vomiting     Past Medical History:  Diagnosis Date  . Asthma     There are no active problems to display for this patient.   History reviewed. No pertinent surgical history.   OB History   No obstetric history on file.      Home Medications    Prior to Admission medications   Medication Sig Start Date End Date Taking? Authorizing Provider  albuterol (PROVENTIL HFA;VENTOLIN HFA) 108 (90 Base) MCG/ACT inhaler Inhale 1-2 puffs into the lungs every 6 (six) hours as needed for wheezing or shortness of breath.    [provider]  albuterol (PROVENTIL) (2.5 MG/3ML) 0.083% nebulizer solution Take 3 mLs (2.5 mg total) by nebulization every 6 (six) hours as needed for wheezing or shortness of breath. 07/28/18   Bethel Born, PA-C  benzonatate (TESSALON) 100 MG capsule Take 1 capsule (100 mg total) by mouth every 8 (eight)  hours. 07/28/18   Bethel Born, PA-C  predniSONE (DELTASONE) 20 MG tablet Take 2 tablets (40 mg total) by mouth daily. 07/28/18   Bethel Born, PA-C    Family History No family history on file.  Social History Social History   Tobacco Use  . Smoking status: Passive Smoke Exposure - Never Smoker  . Smokeless tobacco: Never Used  Substance Use Topics  . Alcohol use: Yes  . Drug use: Yes    Types: Marijuana     Allergies   Patient has no known allergies.   Review of Systems Review of Systems  Constitutional: Positive for fever. Negative for chills.  HENT: Positive for congestion and sore throat.   Eyes: Negative for redness.  Respiratory: Positive for cough. Negative for shortness of breath.   Cardiovascular: Negative for chest pain.  Gastrointestinal: Negative for abdominal pain, diarrhea and vomiting.  Genitourinary: Negative for dysuria and flank pain.  Musculoskeletal: Negative for back pain and neck pain.  Skin: Negative for rash.  Neurological: Negative for headaches.  Hematological: Does not bruise/bleed easily.  Psychiatric/Behavioral: Negative for confusion.     Physical Exam Updated Vital Signs BP 118/72 (BP Location: Right Arm)   Pulse (!) 111   Temp (!) 102.3 F (39.1 C) (Oral)   Resp 16   Ht 1.651 m (5\' 5" )   Wt 77 kg   SpO2 96%   BMI 28.25 kg/m   Physical  Exam Vitals signs and nursing note reviewed.  Constitutional:      Appearance: Normal appearance. She is well-developed.  HENT:     Head: Atraumatic.     Nose: Nose normal.     Mouth/Throat:     Mouth: Mucous membranes are moist.     Pharynx: Oropharynx is clear. No oropharyngeal exudate.  Eyes:     General: No scleral icterus.    Conjunctiva/sclera: Conjunctivae normal.  Neck:     Musculoskeletal: Normal range of motion and neck supple. No neck rigidity or muscular tenderness.     Trachea: No tracheal deviation.     Comments: No stiffness or rigidity.  Cardiovascular:      Rate and Rhythm: Regular rhythm. Tachycardia present.     Pulses: Normal pulses.     Heart sounds: Normal heart sounds. No murmur. No friction rub. No gallop.   Pulmonary:     Effort: Pulmonary effort is normal. No respiratory distress.     Breath sounds: Normal breath sounds. No stridor.  Abdominal:     General: Bowel sounds are normal. There is no distension.     Palpations: Abdomen is soft.     Tenderness: There is no abdominal tenderness. There is no guarding.     Comments: No hsm.   Genitourinary:    Comments: No cva tenderness.  Musculoskeletal:        General: No swelling.  Lymphadenopathy:     Cervical: No cervical adenopathy.  Skin:    General: Skin is warm and dry.     Findings: No rash.  Neurological:     Mental Status: She is alert.     Comments: Alert, speech normal. Steady gait.   Psychiatric:        Mood and Affect: Mood normal.      ED Treatments / Results  Labs (all labs ordered are listed, but only abnormal results are displayed) Results for orders placed or performed during the hospital encounter of 12/08/18  CBC with Differential  Result Value Ref Range   WBC 7.0 4.0 - 10.5 K/uL   RBC 3.38 (L) 3.87 - 5.11 MIL/uL   Hemoglobin 11.3 (L) 12.0 - 15.0 g/dL   HCT 11.934.4 (L) 14.736.0 - 82.946.0 %   MCV 101.8 (H) 80.0 - 100.0 fL   MCH 33.4 26.0 - 34.0 pg   MCHC 32.8 30.0 - 36.0 g/dL   RDW 56.212.1 13.011.5 - 86.515.5 %   Platelets 221 150 - 400 K/uL   nRBC 0.0 0.0 - 0.2 %   Neutrophils Relative % 73 %   Neutro Abs 5.0 1.7 - 7.7 K/uL   Lymphocytes Relative 19 %   Lymphs Abs 1.4 0.7 - 4.0 K/uL   Monocytes Relative 4 %   Monocytes Absolute 0.3 0.1 - 1.0 K/uL   Eosinophils Relative 4 %   Eosinophils Absolute 0.3 0.0 - 0.5 K/uL   Basophils Relative 0 %   Basophils Absolute 0.0 0.0 - 0.1 K/uL   Immature Granulocytes 0 %   Abs Immature Granulocytes 0.02 0.00 - 0.07 K/uL  Comprehensive metabolic panel  Result Value Ref Range   Sodium 137 135 - 145 mmol/L   Potassium 2.9 (L)  3.5 - 5.1 mmol/L   Chloride 107 98 - 111 mmol/L   CO2 21 (L) 22 - 32 mmol/L   Glucose, Bld 134 (H) 70 - 99 mg/dL   BUN 6 6 - 20 mg/dL   Creatinine, Ser 7.840.71 0.44 - 1.00 mg/dL   Calcium 9.2  8.9 - 10.3 mg/dL   Total Protein 7.0 6.5 - 8.1 g/dL   Albumin 3.9 3.5 - 5.0 g/dL   AST 16 15 - 41 U/L   ALT 12 0 - 44 U/L   Alkaline Phosphatase 46 38 - 126 U/L   Total Bilirubin 0.7 0.3 - 1.2 mg/dL   GFR calc non Af Amer >60 >60 mL/min   GFR calc Af Amer >60 >60 mL/min   Anion gap 9 5 - 15  I-Stat beta hCG blood, ED  Result Value Ref Range   I-stat hCG, quantitative <5.0 <5 mIU/mL   Comment 3           EKG None  Radiology Dg Chest Portable 1 View  Result Date: 12/08/2018 CLINICAL DATA:  Cough EXAM: PORTABLE CHEST 1 VIEW COMPARISON:  07/28/2018 FINDINGS: Artifact overlies the chest. Allowing for that, heart and mediastinal shadows are normal. The lungs are clear. There may be central bronchial thickening. No infiltrate, collapse or effusion. No abnormal bone finding. IMPRESSION: Possible bronchitis.  No consolidation or collapse. Electronically Signed   By: Nelson Chimes M.D.   On: 12/08/2018 17:55    Procedures Procedures (including critical care time)  Medications Ordered in ED Medications - No data to display   Initial Impression / Assessment and Plan / ED Course  I have reviewed the triage vital signs and the nursing notes.  Pertinent labs & imaging results that were available during my care of the patient were reviewed by me and considered in my medical decision making (see chart for details).  Labs sent.   CXR.  Reviewed nursing notes and prior charts for additional history.   Covid swab sent.   Acetaminophen po. Po fluids.   CXR reviewed by me - no pna. ?bronchitis by ems.    Labs reviewed by me - wbc normal.   kcl po.   zithromax po.  Patient breathing comfortably, not hypoxic, and stable for d/c.   COVID pending - discussed w pt, results back in 1-2 days,  precautions provided.       Final Clinical Impressions(s) / ED Diagnoses   Final diagnoses:  None    ED Discharge Orders    None       Lajean Saver, MD 12/08/18 [phone]

## 2018-12-09 LAB — NOVEL CORONAVIRUS, NAA (HOSP ORDER, SEND-OUT TO REF LAB; TAT 18-24 HRS): SARS-CoV-2, NAA: NOT DETECTED

## 2018-12-09 LAB — SARS CORONAVIRUS 2 BY RT PCR (HOSPITAL ORDER, PERFORMED IN ~~LOC~~ HOSPITAL LAB): SARS Coronavirus 2: NEGATIVE

## 2018-12-09 MED ORDER — ALBUTEROL SULFATE HFA 108 (90 BASE) MCG/ACT IN AERS
8.0000 | INHALATION_SPRAY | Freq: Once | RESPIRATORY_TRACT | Status: AC
  Administered 2018-12-09: 8 via RESPIRATORY_TRACT
  Filled 2018-12-09: qty 6.7

## 2018-12-09 MED ORDER — PREDNISONE 20 MG PO TABS: 40.0000 mg | ORAL_TABLET | Freq: Every day | ORAL | 0 refills | Status: DC

## 2018-12-09 MED ORDER — AEROCHAMBER PLUS FLO-VU LARGE MISC
1.0000 | Freq: Once | Status: AC
  Administered 2018-12-09: 03:00:00 1

## 2018-12-09 MED ORDER — PREDNISONE 20 MG PO TABS
60.0000 mg | ORAL_TABLET | Freq: Once | ORAL | Status: AC
  Administered 2018-12-09: 03:00:00 60 mg via ORAL
  Filled 2018-12-09: qty 3

## 2018-12-09 MED ORDER — IPRATROPIUM BROMIDE HFA 17 MCG/ACT IN AERS
2.0000 | INHALATION_SPRAY | Freq: Once | RESPIRATORY_TRACT | Status: AC
  Administered 2018-12-09: 04:00:00 2 via RESPIRATORY_TRACT
  Filled 2018-12-09: qty 12.9

## 2018-12-09 MED ORDER — ALBUTEROL SULFATE HFA 108 (90 BASE) MCG/ACT IN AERS
8.0000 | INHALATION_SPRAY | Freq: Once | RESPIRATORY_TRACT | Status: AC
  Administered 2018-12-09: 8 via RESPIRATORY_TRACT

## 2018-12-09 MED ORDER — IPRATROPIUM BROMIDE HFA 17 MCG/ACT IN AERS
2.0000 | INHALATION_SPRAY | Freq: Once | RESPIRATORY_TRACT | Status: AC
  Administered 2018-12-09: 2 via RESPIRATORY_TRACT
  Filled 2018-12-09: qty 12.9

## 2018-12-09 NOTE — Discharge Instructions (Addendum)
1. Medications: albuterol, prednisone, usual home medications including z-pack prescribed yesterday 2. Treatment: rest, drink plenty of fluids, begin OTC antihistamine (Zyrtec or Claritin)  3. Follow Up: Please followup with your primary doctor in 2-3 days for discussion of your diagnoses and further evaluation after today's visit; if you do not have a primary care doctor use the resource guide provided to find one; Please return to the ER for difficulty breathing, high fevers or worsening symptoms.

## 2018-12-09 NOTE — ED Provider Notes (Signed)
[facility] EMERGENCY DEPARTMENT Provider Note   CSN: [id number] Arrival date & time: 12/08/18  2321     History   Chief Complaint Chief Complaint  Patient presents with  . Bronchitis    HPI Cindy Welch is a 23 y.o. female with a hx of asthma presents to the Emergency Department complaining of gradual, persistent, progressively worsening wheezing and shortness of breath onset several hours prior to arrival.  Patient reports she is had 3 days of fevers, cough, sore throat, body aches which have progressively worsened.  She denies known COVID exposure but does report fever to 102 at home.  Patient reports she was evaluated here in the emergency department earlier today and given azithromycin.  She was prescribed a Z-Pak but was unable to fill it before arriving home.  Patient reports after she arrived home, she developed epigastric abdominal pain nausea and vomiting.  She reports she "subsequently had an asthma attack that did not improve after albuterol, 5 mg nebulizer.  Send out COVID test was ordered earlier today.  She reports due to worsening shortness of breath she returned here to the emergency department.  She denies headache, neck pain, chest pain, weakness, dizziness, syncope.      The history is provided by the patient and medical records. No language interpreter was used.    Past Medical History:  Diagnosis Date  . Asthma     There are no active problems to display for this patient.   History reviewed. No pertinent surgical history.   OB History   No obstetric history on file.      Home Medications    Prior to Admission medications   Medication Sig Start Date End Date Taking? Authorizing Provider  albuterol (PROVENTIL HFA;VENTOLIN HFA) 108 (90 Base) MCG/ACT inhaler Inhale 1-2 puffs into the lungs every 6 (six) hours as needed for wheezing or shortness of breath.    [provider]  albuterol (PROVENTIL) (2.5 MG/3ML) 0.083%  nebulizer solution Take 3 mLs (2.5 mg total) by nebulization every 6 (six) hours as needed for wheezing or shortness of breath. 07/28/18   Bethel Born, PA-C  azithromycin (ZITHROMAX Z-PAK) 250 MG tablet Take 1 tablet (250 mg total) by mouth daily for 4 days. 12/09/18 12/13/18  Cathren Laine, MD  benzonatate (TESSALON) 100 MG capsule Take 1 capsule (100 mg total) by mouth every 8 (eight) hours. 07/28/18   Bethel Born, PA-C  predniSONE (DELTASONE) 20 MG tablet Take 2 tablets (40 mg total) by mouth daily. 12/09/18   Hrithik Boschee, Dahlia Client, PA-C    Family History No family history on file.  Social History Social History   Tobacco Use  . Smoking status: Passive Smoke Exposure - Never Smoker  . Smokeless tobacco: Never Used  Substance Use Topics  . Alcohol use: Yes  . Drug use: Yes    Types: Marijuana     Allergies   Patient has no known allergies.   Review of Systems Review of Systems  Constitutional: Positive for fatigue and fever. Negative for appetite change, diaphoresis and unexpected weight change.  HENT: Positive for sore throat. Negative for mouth sores.   Eyes: Negative for visual disturbance.  Respiratory: Positive for cough, chest tightness, shortness of breath and wheezing.   Cardiovascular: Negative for chest pain.  Gastrointestinal: Positive for abdominal pain, nausea and vomiting. Negative for constipation and diarrhea.  Endocrine: Negative for polydipsia, polyphagia and polyuria.  Genitourinary: Negative for dysuria, frequency, hematuria and urgency.  Musculoskeletal: Positive  for myalgias. Negative for back pain and neck stiffness.  Skin: Negative for rash.  Allergic/Immunologic: Negative for immunocompromised state.  Neurological: Negative for syncope, light-headedness and headaches.  Hematological: Does not bruise/bleed easily.  Psychiatric/Behavioral: Negative for sleep disturbance. The patient is not nervous/anxious.      Physical Exam Updated Vital  Signs BP (!) 100/50 (BP Location: Right Arm)   Pulse (!) 133   Temp 98.2 F (36.8 C) (Oral)   Resp (!) 22   SpO2 99%   Physical Exam Vitals signs and nursing note reviewed.  Constitutional:      General: She is not in acute distress.    Appearance: She is not diaphoretic.  HENT:     Head: Normocephalic.  Eyes:     General: No scleral icterus.    Conjunctiva/sclera: Conjunctivae normal.  Neck:     Musculoskeletal: Normal range of motion.  Cardiovascular:     Rate and Rhythm: Regular rhythm. Tachycardia present.     Pulses: Normal pulses.          Radial pulses are 2+ on the right side and 2+ on the left side.  Pulmonary:     Effort: Tachypnea, accessory muscle usage and prolonged expiration present. No respiratory distress or retractions.     Breath sounds: No stridor. Wheezing present.     Comments: Equal chest rise. Moderate increased work of breathing. Inspiratory and expiratory wheezing on exam. Persistent cough. Abdominal:     General: There is no distension.     Palpations: Abdomen is soft.     Tenderness: There is no abdominal tenderness. There is no guarding or rebound.  Musculoskeletal:     Comments: Moves all extremities equally and without difficulty.  Skin:    General: Skin is warm and dry.     Capillary Refill: Capillary refill takes less than 2 seconds.  Neurological:     Mental Status: She is alert.     GCS: GCS eye subscore is 4. GCS verbal subscore is 5. GCS motor subscore is 6.     Comments: Speech is clear and goal oriented.  Psychiatric:        Mood and Affect: Mood normal.      ED Treatments / Results   Results for orders placed or performed during the hospital encounter of 12/08/18  SARS Coronavirus 2 [facility](Hospital order, Performed [facility]) Nasopharyngeal Nasopharyngeal Swab   Specimen: Nasopharyngeal Swab  Result Value Ref Range   SARS Coronavirus 2 NEGATIVE NEGATIVE   Dg Chest Portable 1 View  Result Date: 12/08/2018  CLINICAL DATA:  Cough EXAM: PORTABLE CHEST 1 VIEW COMPARISON:  07/28/2018 FINDINGS: Artifact overlies the chest. Allowing for that, heart and mediastinal shadows are normal. The lungs are clear. There may be central bronchial thickening. No infiltrate, collapse or effusion. No abnormal bone finding. IMPRESSION: Possible bronchitis.  No consolidation or collapse. Electronically Signed   By: Paulina FusiMark  Shogry M.D.   On: 12/08/2018 17:55     Procedures Procedures (including critical care time)  Medications Ordered in ED Medications  albuterol (VENTOLIN HFA) 108 (90 Base) MCG/ACT inhaler 8 puff (8 puffs Inhalation Given 12/09/18 0319)  ipratropium (ATROVENT HFA) inhaler 2 puff (2 puffs Inhalation Given 12/09/18 0351)  predniSONE (DELTASONE) tablet 60 mg (60 mg Oral Given 12/09/18 0318)  AeroChamber Plus Flo-Vu Large MISC 1 each (1 each Other Given 12/09/18 0320)  albuterol (VENTOLIN HFA) 108 (90 Base) MCG/ACT inhaler 8 puff (8 puffs Inhalation Given 12/09/18 0523)  ipratropium (ATROVENT  HFA) inhaler 2 puff (2 puffs Inhalation Given 12/09/18 0522)     Initial Impression / Assessment and Plan / ED Course  I have reviewed the triage vital signs and the nursing notes.  Pertinent labs & imaging results that were available during my care of the patient were reviewed by me and considered in my medical decision making (see chart for details).  Clinical Course as of Dec 09 719  Fri Dec 09, 2018  0255 Last nebulizer 8pm - 5mg    [HM]  0600 SARS Coronavirus 2: NEGATIVE [HM]  0630 Patient with clear and equal breath sounds after albuterol MDI x2.  She reports she is breathing at baseline.   [HM]  0720 Persistent tachycardia, suspect secondary to large volume albuterol.  Pulse Rate(!): 117 [HM]    Clinical Course User Index [HM] Arek Spadafore, Jaslen, Adcox was evaluated in Emergency Department on 12/09/2018 for the symptoms described in the history of present illness. She was  evaluated in the context of the global COVID-19 pandemic, which necessitated consideration that the patient might be at risk for infection with the SARS-CoV-2 virus that causes COVID-19. Institutional protocols and algorithms that pertain to the evaluation of patients at risk for COVID-19 are in a state of rapid change based on information released by regulatory bodies including the CDC and federal and state organizations. These policies and algorithms were followed during the patient's care in the ED.   Patient presents with COVID-like symptoms.  She has a history of asthma and is wheezing on exam.  Given fevers and other associated symptoms I am concerned she potentially has COVID however will need to treat asthma exacerbation.  Albuterol and Atrovent ordered via MDI initially as we have unknown COVID status.  Additionally, patient will be given steroids as I suspect primary reason for wheezing tonight is asthma exacerbation.   Labs and chest x-ray from earlier in the evening reviewed.  Chest x-ray without evidence of ground glass opacities but questionable bronchitis.  Personally evaluated the images.  Suspect this might be early COVID.  Other labs were reassuring.   06:00 COVID negative.  Patient has had 2 rounds of albuterol MDI and Atrovent MDI.  She remains slightly tachypneic and tachycardic however she has clear and equal breath sounds.  She reports she is breathing at baseline and feels normal.  Her work of breathing has improved significantly.  She no longer has accessory muscle usage or prolonged expiration.  Patient low risk for pulmonary embolism.  Patient offered additional treatment/monitoring however declines at this time wishing to be discharged home.  Will discharge with steroid burst to help with asthma exacerbation.  Discussed reasons to return immediately to the emergency department.  Also recommended close primary care follow-up.  Patient states understanding and is in agreement with  the plan.    Final Clinical Impressions(s) / ED Diagnoses   Final diagnoses:  Wheezing  Mild intermittent asthma with exacerbation    ED Discharge Orders         Ordered    predniSONE (DELTASONE) 20 MG tablet  Daily     12/09/18 0702           Kristle Wesch, Jarrett Soho, PA-C 12/09/18 0725    Cardama, Grayce Sessions, MD 12/10/18 [phone]

## 2019-08-06 ENCOUNTER — Encounter (HOSPITAL_COMMUNITY): Payer: Self-pay

## 2019-08-06 ENCOUNTER — Emergency Department (HOSPITAL_COMMUNITY)
Admission: EM | Admit: 2019-08-06 | Discharge: 2019-08-07 | Disposition: A | Discharge status: Home / Self Care | Payer: Self-pay | Attending: Emergency Medicine | Admitting: Emergency Medicine

## 2019-08-06 ENCOUNTER — Other Ambulatory Visit: Payer: Self-pay

## 2019-08-06 DIAGNOSIS — J4521 Mild intermittent asthma with (acute) exacerbation: Secondary | ICD-10-CM | POA: Insufficient documentation

## 2019-08-06 DIAGNOSIS — Z79899 Other long term (current) drug therapy: Secondary | ICD-10-CM | POA: Insufficient documentation

## 2019-08-06 DIAGNOSIS — Z7722 Contact with and (suspected) exposure to environmental tobacco smoke (acute) (chronic): Secondary | ICD-10-CM | POA: Insufficient documentation

## 2019-08-06 NOTE — ED Triage Notes (Signed)
Pt reports increased asthma symptoms since Thursday. Has been using her neb and inhaler at home with minimal relief. A&Ox4.

## 2019-08-07 ENCOUNTER — Encounter (HOSPITAL_COMMUNITY): Payer: Self-pay | Admitting: Emergency Medicine

## 2019-08-07 MED ORDER — PREDNISONE 20 MG PO TABS: ORAL_TABLET | ORAL | 0 refills | Status: DC

## 2019-08-07 MED ORDER — LORATADINE 10 MG PO TABS
10.0000 mg | ORAL_TABLET | Freq: Once | ORAL | Status: AC
  Administered 2019-08-07: 02:00:00 10 mg via ORAL
  Filled 2019-08-07: qty 1

## 2019-08-07 MED ORDER — PREDNISONE 20 MG PO TABS
60.0000 mg | ORAL_TABLET | Freq: Once | ORAL | Status: AC
  Administered 2019-08-07: 60 mg via ORAL
  Filled 2019-08-07: qty 3

## 2019-08-07 MED ORDER — AEROCHAMBER PLUS FLO-VU MEDIUM MISC
1.0000 | Freq: Once | Status: AC
  Administered 2019-08-07: 1
  Filled 2019-08-07: qty 1

## 2019-08-07 MED ORDER — ALBUTEROL SULFATE HFA 108 (90 BASE) MCG/ACT IN AERS
10.0000 | INHALATION_SPRAY | Freq: Once | RESPIRATORY_TRACT | Status: AC
  Administered 2019-08-07: 10 via RESPIRATORY_TRACT
  Filled 2019-08-07: qty 6.7

## 2019-08-07 NOTE — Discharge Instructions (Addendum)
Start claritin daily

## 2019-08-07 NOTE — ED Provider Notes (Signed)
[facility]-EMERGENCY DEPT Provider Note   CSN: [id number] Arrival date & time: 08/06/19  2217     History Chief Complaint  Patient presents with  . Asthma    Cindy Welch is a 24 y.o. female.  The history is provided by the patient.  Asthma This is a recurrent problem. The current episode started more than 2 days ago. The problem occurs constantly. The problem has not changed since onset.Pertinent negatives include no chest pain and no headaches. Nothing aggravates the symptoms. Nothing relieves the symptoms. She has tried rest for the symptoms. The treatment provided no relief.  Patient reports actually being out of her inhaler.  No f/c/r.  No cough.  No covid contacts.  No chest pain.  No leg swelling.       Past Medical History:  Diagnosis Date  . Asthma     There are no problems to display for this patient.   History reviewed. No pertinent surgical history.   OB History   No obstetric history on file.     History reviewed. No pertinent family history.  Social History   Tobacco Use  . Smoking status: Passive Smoke Exposure - Never Smoker  . Smokeless tobacco: Never Used  Substance Use Topics  . Alcohol use: Yes  . Drug use: Yes    Types: Marijuana    Home Medications Prior to Admission medications   Medication Sig Start Date End Date Taking? Authorizing Provider  albuterol (PROVENTIL HFA;VENTOLIN HFA) 108 (90 Base) MCG/ACT inhaler Inhale 1-2 puffs into the lungs every 6 (six) hours as needed for wheezing or shortness of breath.   Yes [provider]  albuterol (PROVENTIL) (2.5 MG/3ML) 0.083% nebulizer solution Take 3 mLs (2.5 mg total) by nebulization every 6 (six) hours as needed for wheezing or shortness of breath. 07/28/18  Yes Bethel Born, PA-C  predniSONE (DELTASONE) 20 MG tablet 3 tabs po day one, then 2 po daily x 4 days 08/07/19   Nicanor Alcon, Valin Massie, MD    Allergies    Patient has no known allergies.  Review  of Systems   Review of Systems  Constitutional: Negative for fever and unexpected weight change.  HENT: Negative for congestion.   Eyes: Negative for visual disturbance.  Respiratory: Positive for wheezing.   Cardiovascular: Negative for chest pain, palpitations and leg swelling.  Genitourinary: Negative for difficulty urinating.  Musculoskeletal: Negative for arthralgias.  Skin: Negative for rash.  Neurological: Negative for headaches.  Psychiatric/Behavioral: Negative for agitation.  All other systems reviewed and are negative.   Physical Exam Updated Vital Signs BP 126/89 (BP Location: Right Arm)   Pulse 92   Temp 98.2 F (36.8 C) (Oral)   Resp (!) 22   Ht 5\' 5"  (1.651 m)   SpO2 94%   BMI 28.25 kg/m   Physical Exam Vitals and nursing note reviewed.  Constitutional:      General: She is not in acute distress.    Appearance: Normal appearance.  HENT:     Head: Normocephalic and atraumatic.     Nose: Nose normal.  Eyes:     Conjunctiva/sclera: Conjunctivae normal.     Pupils: Pupils are equal, round, and reactive to light.  Cardiovascular:     Rate and Rhythm: Normal rate and regular rhythm.     Pulses: Normal pulses.     Heart sounds: Normal heart sounds.  Pulmonary:     Effort: Pulmonary effort is normal. No respiratory distress.  Breath sounds: Normal breath sounds. No wheezing or rhonchi.     Comments: Clear with good airmovement post treatment.  Inhaler provided  Abdominal:     General: Abdomen is flat. Bowel sounds are normal.     Tenderness: There is no abdominal tenderness. There is no guarding.  Musculoskeletal:        General: Normal range of motion.     Cervical back: Normal range of motion and neck supple.  Skin:    General: Skin is warm and dry.     Capillary Refill: Capillary refill takes less than 2 seconds.  Neurological:     General: No focal deficit present.     Mental Status: She is alert and oriented to person, place, and time.    Psychiatric:        Mood and Affect: Mood normal.        Behavior: Behavior normal.     ED Results / Procedures / Treatments   Labs (all labs ordered are listed, but only abnormal results are displayed) Labs Reviewed - No data to display  EKG None  Radiology No results found.  Procedures Procedures (including critical care time)  Medications Ordered in ED Medications  albuterol (VENTOLIN HFA) 108 (90 Base) MCG/ACT inhaler 10 puff (10 puffs Inhalation Given 08/07/19 0136)  AeroChamber Plus Flo-Vu Medium MISC 1 each (1 each Other Given 08/07/19 0137)  predniSONE (DELTASONE) tablet 60 mg (60 mg Oral Given 08/07/19 0136)  loratadine (CLARITIN) tablet 10 mg (10 mg Oral Given 08/07/19 0136)    ED Course  I have reviewed the triage vital signs and the nursing notes.  Pertinent labs & imaging results that were available during my care of the patient were reviewed by me and considered in my medical decision making (see chart for details).   I do not believe this is pneumonia or an infection, I do not believe labs or imaging are necessary at this time.  Patient was out of her inhaler.  I Patient given full size inhaler and spacer.  Have recommended claritin daily.  Have sent RX for steroids to the pharmacy.  Strict return precautions given.  Cindy Welch was evaluated in Emergency Department on 08/07/2019 for the symptoms described in the history of present illness. She was evaluated in the context of the global COVID-19 pandemic, which necessitated consideration that the patient might be at risk for infection with the SARS-CoV-2 virus that causes COVID-19. Institutional protocols and algorithms that pertain to the evaluation of patients at risk for COVID-19 are in a state of rapid change based on information released by regulatory bodies including the CDC and federal and state organizations. These policies and algorithms were followed during the patient's care in the ED.  Final  Clinical Impression(s) / ED Diagnoses Final diagnoses:  Mild intermittent asthma with exacerbation   Return for weakness, numbness, changes in vision or speech, fevers >100.4 unrelieved by medication, shortness of breath, intractable vomiting, or diarrhea, abdominal pain, Inability to tolerate liquids or food, cough, altered mental status or any concerns. No signs of systemic illness or infection. The patient is nontoxic-appearing on exam and vital signs are within normal limits.   I have reviewed the triage vital signs and the nursing notes. Pertinent labs &imaging results that were available during my care of the patient were reviewed by me and considered in my medical decision making (see chart for details).  After history, exam, and medical workup I feel the patient has been appropriately medically screened  and is safe for discharge home. Pertinent diagnoses were discussed with the patient. Patient was givenstrictreturn precautions.    Rx / DC Orders ED Discharge Orders         Ordered    predniSONE (DELTASONE) 20 MG tablet     08/07/19 0244           Mackinzee Roszak, MD 08/07/19 1594

## 2020-01-01 ENCOUNTER — Other Ambulatory Visit: Payer: Self-pay

## 2020-01-01 ENCOUNTER — Emergency Department (HOSPITAL_COMMUNITY)
Admission: EM | Admit: 2020-01-01 | Discharge: 2020-01-01 | Disposition: A | Discharge status: Home / Self Care | Payer: HRSA Program | Attending: Emergency Medicine | Admitting: Emergency Medicine

## 2020-01-01 ENCOUNTER — Emergency Department (HOSPITAL_COMMUNITY): Payer: HRSA Program

## 2020-01-01 DIAGNOSIS — U071 COVID-19: Secondary | ICD-10-CM | POA: Insufficient documentation

## 2020-01-01 DIAGNOSIS — J45909 Unspecified asthma, uncomplicated: Secondary | ICD-10-CM | POA: Diagnosis not present

## 2020-01-01 DIAGNOSIS — Z7722 Contact with and (suspected) exposure to environmental tobacco smoke (acute) (chronic): Secondary | ICD-10-CM | POA: Insufficient documentation

## 2020-01-01 DIAGNOSIS — R0789 Other chest pain: Secondary | ICD-10-CM

## 2020-01-01 DIAGNOSIS — R079 Chest pain, unspecified: Secondary | ICD-10-CM | POA: Diagnosis present

## 2020-01-01 LAB — BASIC METABOLIC PANEL
Anion gap: 6 (ref 5–15)
BUN: 7 mg/dL (ref 6–20)
CO2: 25 mmol/L (ref 22–32)
Calcium: 8.7 mg/dL — ABNORMAL LOW (ref 8.9–10.3)
Chloride: 107 mmol/L (ref 98–111)
Creatinine, Ser: 0.67 mg/dL (ref 0.44–1.00)
GFR calc Af Amer: >60 mL/min (ref >60)
GFR calc non Af Amer: >60 mL/min (ref >60)
Glucose, Bld: 101 mg/dL — ABNORMAL HIGH (ref 70–99)
Potassium: 3.7 mmol/L (ref 3.5–5.1)
Sodium: 138 mmol/L (ref 135–145)

## 2020-01-01 LAB — CBC
HCT: 37.4 % (ref 36.0–46.0)
Hemoglobin: 12.1 g/dL (ref 12.0–15.0)
MCH: 32.7 pg (ref 26.0–34.0)
MCHC: 32.4 g/dL (ref 30.0–36.0)
MCV: 101.1 fL — ABNORMAL HIGH (ref 80.0–100.0)
Platelets: 225 10*3/uL (ref 150–400)
RBC: 3.7 MIL/uL — ABNORMAL LOW (ref 3.87–5.11)
RDW: 12 % (ref 11.5–15.5)
WBC: 3.5 10*3/uL — ABNORMAL LOW (ref 4.0–10.5)
nRBC: 0 % (ref 0.0–0.2)

## 2020-01-01 LAB — SARS CORONAVIRUS 2 BY RT PCR (HOSPITAL ORDER, PERFORMED IN ~~LOC~~ HOSPITAL LAB): SARS Coronavirus 2: POSITIVE — AB

## 2020-01-01 LAB — I-STAT BETA HCG BLOOD, ED (MC, WL, AP ONLY): I-stat hCG, quantitative: <5 m[IU]/mL (ref <5)

## 2020-01-01 LAB — TROPONIN I (HIGH SENSITIVITY): Troponin I (High Sensitivity): <2 ng/L (ref <18)

## 2020-01-01 NOTE — ED Triage Notes (Signed)
Pt reports shocking stabbing pain under breast since Friday. Pt denies SOB, reports bilateral elbow pain. Pt awake, alert, appropriate, VSS.

## 2020-01-01 NOTE — Discharge Instructions (Signed)
You have been seen here for right-sided chest pain.  Lab work and imaging all look reassuring.  I recommend taking over-the-counter pain medications like ibuprofen or Tylenol every 6 as needed.  Your Covid test is pending I would like you to self quarantine until you see results on MyChart.  If you are Covid positive you must remain self quarantine for next 10 days starting on symptom onset.  If you are Covid positive please contact the "post Covid care" number as they can provide you with further instruction and evaluation for your Covid symptoms.  If you are Covid negative and symptoms persist over 2 weeks I like you to follow-up with your primary care provider for further evaluation management.  I want to come back to emergency department if you develop chest pain, shortness of breath, severe abdominal pain, nausea, vomiting, diarrhea as these symptoms require further evaluation and management.

## 2020-01-01 NOTE — ED Provider Notes (Signed)
[facility] EMERGENCY DEPARTMENT Provider Note   CSN: [id number] Arrival date & time: 01/01/20  7829     History Chief Complaint  Patient presents with  . Chest Pain    Cindy Welch is a 24 y.o. female.  HPI   Patient with significant medical history of asthma presents to the emergency department with chief complaint of right lower chest pain that has been going  on since Friday.  Patient states the pain is episodic, lasts for about 1 to 2 minutes and will go away on its own.  She describes the pain as a sharp stabbing pain which does not radiate, she does not become diaphoretic, short of breath, dizziness or paresthesias in her upper or lower extremities.  Patient states she is never experienced this pain before.  Denies any recent traumas.  She has no history of DVTs, PEs, denies recent travels, surgeries, not on hormone therapy.  Patient denies any alleviating factors at this time.  Patient denies headache, fever, chills, shortness of breath, dumping, nausea, vomiting, diarrhea, pedal edema.  Past Medical History:  Diagnosis Date  . Asthma     There are no problems to display for this patient.   No past surgical history on file.   OB History   No obstetric history on file.     No family history on file.  Social History   Tobacco Use  . Smoking status: Passive Smoke Exposure - Never Smoker  . Smokeless tobacco: Never Used  Vaping Use  . Vaping Use: Never used  Substance Use Topics  . Alcohol use: Yes  . Drug use: Yes    Types: Marijuana    Home Medications Prior to Admission medications   Medication Sig Start Date End Date Taking? Authorizing Provider  albuterol (PROVENTIL HFA;VENTOLIN HFA) 108 (90 Base) MCG/ACT inhaler Inhale 1-2 puffs into the lungs every 6 (six) hours as needed for wheezing or shortness of breath.    [provider]  albuterol (PROVENTIL) (2.5 MG/3ML) 0.083% nebulizer solution Take 3 mLs (2.5 mg total) by  nebulization every 6 (six) hours as needed for wheezing or shortness of breath. 07/28/18   Bethel Born, PA-C  predniSONE (DELTASONE) 20 MG tablet 3 tabs po day one, then 2 po daily x 4 days 08/07/19   Nicanor Alcon, April, MD    Allergies    Patient has no known allergies.  Review of Systems   Review of Systems  Constitutional: Negative for chills and fever.  HENT: Positive for congestion. Negative for tinnitus, trouble swallowing and voice change.   Eyes: Negative for visual disturbance.  Respiratory: Negative for cough and shortness of breath.   Cardiovascular: Positive for chest pain.  Gastrointestinal: Negative for abdominal pain, diarrhea, nausea and vomiting.  Genitourinary: Negative for enuresis, flank pain and frequency.  Musculoskeletal: Negative for back pain.  Skin: Negative for rash.  Neurological: Negative for dizziness and headaches.  Hematological: Does not bruise/bleed easily.    Physical Exam Updated Vital Signs BP (!) 100/59 (BP Location: Right Arm)   Pulse 86   Temp 98.1 F (36.7 C) (Oral)   Resp 14   Ht 5\' 5"  (1.651 m)   Wt 79.4 kg   LMP 11/21/2019   SpO2 100%   BMI 29.12 kg/m   Physical Exam Vitals and nursing note reviewed.  Constitutional:      General: She is not in acute distress.    Appearance: She is not ill-appearing.  HENT:  Head: Normocephalic and atraumatic.     Nose: No congestion.     Mouth/Throat:     Mouth: Mucous membranes are moist.     Pharynx: Oropharynx is clear. No oropharyngeal exudate or posterior oropharyngeal erythema.  Eyes:     General: No scleral icterus. Cardiovascular:     Rate and Rhythm: Normal rate and regular rhythm.     Pulses: Normal pulses.     Heart sounds: No murmur heard.  No friction rub. No gallop.      Comments: Chest was visualized, no gross abnormalities noted, patient had normal heart tones, normal rate and rhythm.  Patient slight tenderness to palpation along her right lower rib. Pulmonary:      Effort: No respiratory distress.     Breath sounds: No wheezing, rhonchi or rales.  Abdominal:     General: There is no distension.     Tenderness: There is no abdominal tenderness. There is no right CVA tenderness, left CVA tenderness or guarding.  Musculoskeletal:        General: No swelling.  Skin:    General: Skin is warm and dry.     Findings: No rash.  Neurological:     General: No focal deficit present.     Mental Status: She is alert and oriented to person, place, and time.  Psychiatric:        Mood and Affect: Mood normal.     ED Results / Procedures / Treatments   Labs (all labs ordered are listed, but only abnormal results are displayed) Labs Reviewed  SARS CORONAVIRUS 2 BY RT PCR (HOSPITAL ORDER, PERFORMED IN [facility]) - Abnormal; Notable for the following components:      Result Value   SARS Coronavirus 2 POSITIVE (*)    All other components within normal limits  BASIC METABOLIC PANEL - Abnormal; Notable for the following components:   Glucose, Bld 101 (*)    Calcium 8.7 (*)    All other components within normal limits  CBC - Abnormal; Notable for the following components:   WBC 3.5 (*)    RBC 3.70 (*)    MCV 101.1 (*)    All other components within normal limits  I-STAT BETA HCG BLOOD, ED ([facility], [facility], [facility] ONLY)  TROPONIN I (HIGH SENSITIVITY)  TROPONIN I (HIGH SENSITIVITY)    EKG EKG Interpretation  Date/Time:  Monday January 01 2020 07:21:41 EDT Ventricular Rate:  81 PR Interval:  152 QRS Duration: 88 QT Interval:  378 QTC Calculation: 439 R Axis:   83 Text Interpretation: Normal sinus rhythm Early repolarization Normal ECG No previous ECGs available Confirmed by Frederick Peers [phone]) on 01/01/2020 2:42:53 PM   Radiology DG Chest 2 View  Result Date: 01/01/2020 CLINICAL DATA:  Chest pain. EXAM: CHEST - 2 VIEW COMPARISON:  12/08/2018 FINDINGS: The heart size and mediastinal contours are within normal limits. Both lungs are clear. No  pleural effusions. No pneumothorax. The visualized skeletal structures are unremarkable. IMPRESSION: No acute cardiopulmonary disease. Electronically Signed   By: Feliberto Harts MD   On: 01/01/2020 07:45    Procedures Procedures (including critical care time)  Medications Ordered in ED Medications - No data to display  ED Course  I have reviewed the triage vital signs and the nursing notes.  Pertinent labs & imaging results that were available during my care of the patient were reviewed by me and considered in my medical decision making (see chart for details).    MDM Rules/Calculators/A&P  I have personally reviewed all imaging, labs and have interpreted them.  Patient presents emergency department with chief complaint of right lower chest pain x2 days.  On exam she was alert and oriented, did not appear in acute distress, vital signs reassuring.  Lung sounds were clear bilaterally, patient had slight tenderness to palpation along her right lower ribs, abdomen soft nontender to palpation, no pedal edema noted.  Will order screening labs and imaging for further evaluation.  CBC does not show leukocytosis or signs of anemia, BMP does not show electrolyte abnormalities, metabolic acidosis, AKI or anion gap.  hCG was less than 5, troponin was less than 2, chest x-ray was unremarkable.  EKG was sinus rhythm without signs of ischemia or ST depression or elevation.  I have low suspicion patient suffering from a acute cardiac abnormality as this presentation be atypical for MI  with right lower chest pain, episodic lasting for 1 to 2 minutes, reproducible upon palpation, negative troponin, EKG no signs of ischemia.  Low suspicion for PE as patient denies pleuritic chest pain, shortness of breath, no pedal edema noted on exam, she was PERC negative.  Low suspicion for systemic infection as patient is nontoxic-appearing, vital signs reassuring, no obvious source of infection  on exam.  Low suspicion for acute abdomen requiring surgical intervention as denies abdominal pain, nausea, vomiting, tolerating p.o. no peritoneal signs on exam.  I suspect patient suffering from a muscular strain but differential diagnosis includes costochondritis, anxiety, acid reflux.  Will recommend over-the-counter pain medication follow-up with her primary care provider. will also encourage patient to self quarantine as Covid test is pending, if positive will refer patient to post Covid care, if negative follow-up with PCP.  Patient appears to be resting comfortably, vital signs reassuring, no indication for hospital admission.  Patient discussed with attending who agrees assessment plan.  Patient is given at home care and strict return precautions.  Patient verbalized that she understood and agreed to plan. Final Clinical Impression(s) / ED Diagnoses Final diagnoses:  Atypical chest pain    Rx / DC Orders ED Discharge Orders    None       Carroll Sage, PA-C 01/01/20 1503    Little, Ambrose Finland, MD 01/01/20 2037

## 2020-01-02 ENCOUNTER — Other Ambulatory Visit: Payer: Self-pay | Admitting: Physician Assistant

## 2020-01-02 DIAGNOSIS — J45909 Unspecified asthma, uncomplicated: Secondary | ICD-10-CM

## 2020-01-02 DIAGNOSIS — U071 COVID-19: Secondary | ICD-10-CM

## 2020-01-02 DIAGNOSIS — Z6825 Body mass index (BMI) 25.0-25.9, adult: Secondary | ICD-10-CM

## 2020-01-02 NOTE — Progress Notes (Signed)
I connected by phone with Cindy Welch on 01/02/2020 at 12:07 PM to discuss the potential use of a new treatment for mild to moderate COVID-19 viral infection in non-hospitalized patients.  This patient is a 24 y.o. female that meets the FDA criteria for Emergency Use Authorization of COVID monoclonal antibody casirivimab/imdevimab.  Has a (+) direct SARS-CoV-2 viral test result  Has mild or moderate COVID-19   Is NOT hospitalized due to COVID-19  Is within 10 days of symptom onset  Has at least one of the high risk factor(s) for progression to severe COVID-19 and/or hospitalization as defined in EUA.  Specific high risk criteria : BMI > 25, Chronic Lung Disease and Other high risk medical condition per CDC:  SVI   I have spoken and communicated the following to the patient or parent/caregiver regarding COVID monoclonal antibody treatment:  1. FDA has authorized the emergency use for the treatment of mild to moderate COVID-19 in adults and pediatric patients with positive results of direct SARS-CoV-2 viral testing who are 9 years of age and older weighing at least 40 kg, and who are at high risk for progressing to severe COVID-19 and/or hospitalization.  2. The significant known and potential risks and benefits of COVID monoclonal antibody, and the extent to which such potential risks and benefits are unknown.  3. Information on available alternative treatments and the risks and benefits of those alternatives, including clinical trials.  4. Patients treated with COVID monoclonal antibody should continue to self-isolate and use infection control measures (e.g., wear mask, isolate, social distance, avoid sharing personal items, clean and disinfect "high touch" surfaces, and frequent handwashing) according to CDC guidelines.   5. The patient or parent/caregiver has the option to accept or refuse COVID monoclonal antibody treatment.  After reviewing this information with the patient,  The patient agreed to proceed with receiving casirivimab\imdevimab infusion and will be provided a copy of the Fact sheet prior to receiving the infusion. [name] [name] 01/02/2020 12:07 PM

## 2020-01-03 ENCOUNTER — Ambulatory Visit (HOSPITAL_COMMUNITY): Payer: Medicaid Other

## 2020-01-05 ENCOUNTER — Telehealth: Payer: Self-pay | Admitting: Pediatrics

## 2020-01-05 NOTE — Telephone Encounter (Signed)
Called # on file to schedule hfu w/ [facility] per referral from [facility], person that answered said I had the wrong number

## 2020-04-16 ENCOUNTER — Other Ambulatory Visit: Payer: Medicaid Other

## 2020-04-16 DIAGNOSIS — Z20822 Contact with and (suspected) exposure to covid-19: Secondary | ICD-10-CM

## 2020-04-17 LAB — NOVEL CORONAVIRUS, NAA: SARS-CoV-2, NAA: NOT DETECTED

## 2020-04-17 LAB — SARS-COV-2, NAA 2 DAY TAT

## 2020-08-22 ENCOUNTER — Other Ambulatory Visit: Payer: Self-pay

## 2020-08-22 ENCOUNTER — Ambulatory Visit
Admission: RE | Admit: 2020-08-22 | Discharge: 2020-08-22 | Discharge status: Against Medical Advice | Payer: Medicaid Other | Source: Ambulatory Visit | Attending: Pediatrics | Admitting: Pediatrics

## 2020-09-03 ENCOUNTER — Other Ambulatory Visit: Payer: Self-pay

## 2020-09-03 ENCOUNTER — Ambulatory Visit (INDEPENDENT_AMBULATORY_CARE_PROVIDER_SITE_OTHER): Payer: Self-pay | Admitting: *Deleted

## 2020-09-03 VITALS — BP 127/70 | HR 71 | Temp 97.8°F | Ht 64.0 in | Wt 167.8 lb

## 2020-09-03 DIAGNOSIS — Z3201 Encounter for pregnancy test, result positive: Secondary | ICD-10-CM

## 2020-09-03 DIAGNOSIS — Z3400 Encounter for supervision of normal first pregnancy, unspecified trimester: Secondary | ICD-10-CM

## 2020-09-03 HISTORY — DX: Encounter for supervision of normal first pregnancy, unspecified trimester: Z34.00

## 2020-09-03 LAB — POCT URINE PREGNANCY: Preg Test, Ur: POSITIVE — AB

## 2020-09-03 MED ORDER — BLOOD PRESSURE MONITOR AUTOMAT DEVI: 1.0000 | Freq: Every day | 0 refills | Status: DC

## 2020-09-03 MED ORDER — VITAFOL GUMMIES 3.33-0.333-34.8 MG PO CHEW: 3.0000 | CHEWABLE_TABLET | Freq: Every day | ORAL | 12 refills | Status: DC

## 2020-09-03 MED ORDER — GOJJI WEIGHT SCALE MISC: 1.0000 | Freq: Every day | 0 refills | Status: DC | PRN

## 2020-09-03 NOTE — Patient Instructions (Addendum)
[email or web address]">  First Trimester of Pregnancy  The first trimester of pregnancy starts on the first day of your last menstrual period until the end of week 12. This is also called months 1 through 3 of pregnancy. Body changes during your first trimester Your body goes through many changes during pregnancy. The changes usually return to normal after your baby is born. Physical changes  You may gain or lose weight.  Your breasts may grow larger and hurt. The area around your nipples may get darker.  Dark spots or blotches may develop on your face.  You may have changes in your hair. Health changes  You may feel like you might vomit (nauseous), and you may vomit.  You may have heartburn.  You may have headaches.  You may have trouble pooping (constipation).  Your gums may bleed. Other changes  You may get tired easily.  You may pee (urinate) more often.  Your menstrual periods will stop.  You may not feel hungry.  You may want to eat certain kinds of food.  You may have changes in your emotions from day to day.  You may have more dreams. Follow these instructions at home: Medicines  Take over-the-counter and prescription medicines only as told by your doctor. Some medicines are not safe during pregnancy.  Take a prenatal vitamin that contains at least 600 micrograms (mcg) of folic acid. Eating and drinking  Eat healthy meals that include: ? Fresh fruits and vegetables. ? Whole grains. ? Good sources of protein, such as meat, eggs, or tofu. ? Low-fat dairy products.  Avoid raw meat and unpasteurized juice, milk, and cheese.  If you feel like you may vomit, or you vomit: ? Eat 4 or 5 small meals a day instead of 3 large meals. ? Try eating a few soda crackers. ? Drink liquids between meals instead of during meals.  You may need to take these actions to prevent or treat trouble pooping: ? Drink enough fluids to keep your  pee (urine) pale yellow. ? Eat foods that are high in fiber. These include beans, whole grains, and fresh fruits and vegetables. ? Limit foods that are high in fat and sugar. These include fried or sweet foods. Activity  Exercise only as told by your doctor. Most people can do their usual exercise routine during pregnancy.  Stop exercising if you have cramps or pain in your lower belly (abdomen) or low back.  Do not exercise if it is too hot or too humid, or if you are in a place of great height (high altitude).  Avoid heavy lifting.  If you choose to, you may have sex unless your doctor tells you not to. Relieving pain and discomfort  Wear a good support bra if your breasts are sore.  Rest with your legs raised (elevated) if you have leg cramps or low back pain.  If you have bulging veins (varicose veins) in your legs: ? Wear support hose as told by your doctor. ? Raise your feet for 15 minutes, 3-4 times a day. ? Limit salt in your food. Safety  Wear your seat belt at all times when you are in a car.  Talk with your doctor if someone is hurting you or yelling at you.  Talk with your doctor if you are feeling sad or have thoughts of hurting yourself. Lifestyle  Do not use hot tubs, steam rooms, or saunas.  Do not douche. Do not use tampons or scented sanitary pads.  Do not use herbal medicines, illegal drugs, or medicines that are not approved by your doctor. Do not drink alcohol.  Do not smoke or use any products that contain nicotine or tobacco. If you need help quitting, ask your doctor.  Avoid cat litter boxes and soil that is used by cats. These carry germs that can cause harm to the baby and can cause a loss of your baby by miscarriage or stillbirth. General instructions  Keep all follow-up visits. This is important.  Ask for help if you need counseling or if you need help with nutrition. Your doctor can give you advice or tell you where to go for help.  Visit  your dentist. At home, brush your teeth with a soft toothbrush. Floss gently.  Write down your questions. Take them to your prenatal visits. Where to find more information  American Pregnancy Association: americanpregnancy.org  [employer] of Obstetricians and Gynecologists: www.acog.org  Office on Women's Health: MightyReward.co.nz Contact a doctor if:  You are dizzy.  You have a fever.  You have mild cramps or pressure in your lower belly.  You have a nagging pain in your belly area.  You continue to feel like you may vomit, you vomit, or you have watery poop (diarrhea) for 24 hours or longer.  You have a bad-smelling fluid coming from your vagina.  You have pain when you pee.  You are exposed to a disease that spreads from person to person, such as chickenpox, measles, Zika virus, HIV, or hepatitis. Get help right away if:  You have spotting or bleeding from your vagina.  You have very bad belly cramping or pain.  You have shortness of breath or chest pain.  You have any kind of injury, such as from a fall or a car crash.  You have new or increased pain, swelling, or redness in an arm or leg. Summary  The first trimester of pregnancy starts on the first day of your last menstrual period until the end of week 12 (months 1 through 3).  Eat 4 or 5 small meals a day instead of 3 large meals.  Do not smoke or use any products that contain nicotine or tobacco. If you need help quitting, ask your doctor.  Keep all follow-up visits. This information is not intended to replace advice given to you by your health care provider. Make sure you discuss any questions you have with your health care provider. Document Revised: 09/06/2019 Document Reviewed: 07/13/2019 Elsevier Patient Education  2021 Elsevier Inc.  Vaginal Bleeding During Pregnancy, First Trimester A small amount of bleeding from the vagina is common during early pregnancy. This kind of bleeding is  also called spotting. Sometimes the bleeding is normal and does not cause problems. At other times, though, bleeding may be a sign of something serious. Normal bleeding in pregnancy can happen:  When the fertilized egg attaches itself to your womb.  When blood vessels change because of the pregnancy.  When you have pelvic exams.  When you have sex. Abnormal bleeding can happen:  When you have an infection.  When you have growths in your womb. The growths are called polyps.  If you are having a miscarriage or at risk of having one.  If you have other problems in your pregnancy. Tell your doctor right away about any bleeding from your vagina. Follow these instructions at home: Watch your bleeding  Watch your condition for any changes. Let your doctor know if you are worried about something.  Try to know what causes your bleeding. Ask yourself these questions: ? Does the bleeding start on its own? ? Does the bleeding start after something is done, such as sex or a pelvic exam?  Use a diary to write the things you see about your bleeding. Write in your diary: ? If the bleeding flows freely without stopping, or if it starts and stops, and then starts again. ? If the bleeding is heavy or light. ? How many pads you use in a day and how much blood is in them.  Tell your doctor if you pass tissue. He or she may want to see it.   Activity  Follow your doctor's instructions about how active you can be. Ask what activities are safe for you.  Do not have sex or orgasms until your doctor says that this is safe.  If needed, make plans for someone to help with your normal activities. General instructions  Take over-the-counter and prescription medicines only as told by your doctor.  Do not take aspirin because it can cause bleeding.  Do not use tampons.  Do not douche.  Keep all follow-up visits. Contact a doctor if:  You have vaginal bleeding at any time while you are  pregnant.  You have cramps.  You have a fever or chills. Get help right away if:  You have very bad cramps in your back or belly (abdomen).  You pass large clots or a lot of tissue from your vagina.  Your bleeding gets worse.  You feel light-headed.  You feel weak.  You pass out (faint).  You have chills.  You are leaking fluid from your vagina.  You have a gush of fluid from your vagina. Summary  Sometimes vaginal bleeding during pregnancy is normal and does not cause problems. At other times, bleeding may be a sign of something serious.  Tell your doctor right away about any bleeding from your vagina.  Follow your doctor's instructions about how active you can be. You may need someone to help you with your normal activities.  Keep all follow-up visits. This information is not intended to replace advice given to you by your health care provider. Make sure you discuss any questions you have with your health care provider. Document Revised: 12/21/2019 Document Reviewed: 12/21/2019 Elsevier Patient Education  2021 Elsevier Inc.  Warning Signs During Pregnancy During pregnancy, your body goes through many changes. Some changes may be uncomfortable, but most do not represent a serious problem. However, it is important to learn when certain signs and symptoms may indicate a problem. Talk with your health care provider about your current health and any medical conditions you have. Make sure you know the symptoms to watch for and report. How does this affect me? Warning signs during pregnancy Let your health care provider know if you have any of the following warning signs:  Dizziness or feeling faint.  Nausea, vomiting, or diarrhea that lasts 24 hours or longer.  Spotting or bleeding from your vagina.  Abdominal cramping or pain in your pelvis or lower back.  Shortness of breath, difficulty breathing, or chest pain.  New or increased pain, swelling, or redness in an  arm or leg.  Your baby is moving less than usual or is not moving. You should also watch for signs of a serious medical condition called preeclampsia. This may include:  A severe, throbbing headache that does not go away.  Vision changes, such as blurred or double vision, light sensitivity, or  seeing spots in front of your eyes.  Sudden or extreme swelling of your face, hands, legs, or feet. Pregnancy causes changes that may make it more likely for you to get an infection. Let your health care provider know if you have signs of infection, such as:  A fever.  A bad-smelling vaginal discharge.  Pain or burning when you urinate. How does this affect my baby? Throughout your pregnancy, always report any of the warning signs of a problem to your health care provider. This can help prevent complications that may affect your baby, including:  Increased risk for premature birth.  Infection that may be transmitted to your baby.  Increased risk for stillbirth. Follow these instructions at home:  Take over-the-counter and prescription medicines only as told by your health care provider.  Keep all follow-up visits. This is important.   Where to find more information  Office on Women's Health: CelebrityForeclosures.cz  [employer] of Obstetricians and Gynecologists: EmploymentAssurance.cz Contact a health care provider if:  You have any warning signs of problems during your pregnancy.  Any of the following apply to you during your pregnancy: ? You have strong emotions, such as sadness or anxiety, that interfere with work or personal relationships. ? You feel unsafe in your home. ? You are using tobacco products, alcohol, or drugs, and you need help to stop. Get help right away if:  You have signs or symptoms of labor before 37 weeks of pregnancy. These include: ? Contractions that are 5 minutes or less apart, or that increase in frequency, intensity, or  length. ? Sudden, sharp abdominal pain or low back pain. ? Uncontrolled gush or trickle of fluid from your vagina. Summary  Always report any warning signs to your health care provider to prevent complications that may affect both you and your baby.  Talk with your health care provider about your current health and any medical conditions you have. Make sure you know the symptoms to watch for and report.  Keep all follow-up visits. This is important. This information is not intended to replace advice given to you by your health care provider. Make sure you discuss any questions you have with your health care provider. Document Revised: 09/06/2019 Document Reviewed: 08/04/2019 Elsevier Patient Education  2021 Elsevier Inc.  AREA FAMILY PRACTICE PHYSICIANS  [place] ([id number]) . [facility] o [address] [place] [place], [place] [address] o [phone] o Mon-Fri 8:30-12:30, 1:30-5:00 o Accepting Medicaid . [facility] at [facility] [address], [place], [place] [address] o [phone] o Mon-Fri 8:00-5:30 . [employer] [employer] o [address], [place], [place] [address] o [phone], Tue, Thur, Fri 8:30-5:00, Wed 10:00-7:00 (closed 1-2pm) o Accepting Medicaid . [facility] o [address], [place], [place]  [address] o Phone - [phone]   Fax - [phone]  [place] [phone]) . [facility] o [address], [place], [place] [address] o [phone] o Mon-Fri 8:00-5:00 . [facility] at [facility])  o [address] [place] [place], [place] [address] o [phone] o Mon-Fri 8:30-5:30, Sat (Oct.-Mar.) 9:00-1:00 o Accepting [facility] [phone]) . [facility] at [facility] o [address], [place], [place] [address] o [phone] o Mon-Fri 8:00-5:00  [place] [phone]) . [facility] Medicine at [facility] o [address] [place], [place] [address] o [phone] o Mon-Fri 8:00-5:00 . Nature conservation officer at  [place] o [address] [place], [place], [place] [address] o [phone] o Mon-Fri 8:00-5:00 . Nature conservation officer at [employer] o [address] [place], [place] [address] o [phone] o Mon-Fri 8:00-5:00 . [facility] o [address] [place] [address] o [phone] o Mon-Fri 7:30-5:30  [place] [phone] & [phone]) . [facility] o [address], [place], [place] [address] o [phone] o Mon-Thur 8:00-6:00 o Accepting Medicaid . [facility] Medicine o [address] [place], [place] [address] o [phone] o Mon-Thur 7:30-7:30, Fri 7:30-4:30 o Accepting Medicaid . [facility] at [facility] o [phone] [address], [place], [place]  [address] o [phone]   Fax - [phone]  [place]  [phone] & [phone]) . Nature conservation officer at [employer] o [address] [place], [place] [address] o [phone] o Mon-Fri 7:00-5:00 . [facility] Family Medicine o [address], [place], [place] [address] o [phone] o Mon-Fri 8:00-5:00 o Accepting Medicaid . [facility] - [facility] o [address], [place], [place] [address] o [phone] o Mon-Fri 8:00-5:00 o Accepting Medicaid  [facility] [place] [phone]) . [facility] at [facility] o [address] [place], [place] [address] o [phone] o Mon-Fri 8:00-5:00 . [facility] - [facility] at [employer]) o [address], [place], [place] [address] o [phone] o Mon-Fri 8:00-5:00 o Accepting Medicaid . [facility] - [facility] at [employer]) o [address], [place], [place] [address] o [phone] o Mon-Fri 8:00-5:30, Sat&Sun by appointment (phones open at 8:30) o Accepting [facility] [phone] & [phone]) . [facility] Medicine o [address], [place], [place] [address] o [phone] o Mon-Thur 8:00-7:00, Fri 8:00-5:00, Sat 8:00-12:00, Sun 9:00-12:00 o Accepting Medicaid . [facility] Medicine at [facility] [address], [place], [place] [address] o [phone] o Mon-Thur 8:00-5:00 o Accepting Medicaid . [facility] at [place] o [address] [place], [place] [address] o [phone] o Mon-Fri 8:00-5:30, Sat (Oct.-Mar.) 9:00-1:00 o Accepting [employer] [phone]) . [facility] o [address] [place], [place] [address] o [phone] o Mon-Fri 8:00-5:00 o Accepting Medicaid   [facility] [phone]) . [facility] Medicine at [facility] o [address], [place], [place] [address] o [phone] o Mon-Fri 8:00-5:00 . Nature conservation officer at [facility] o [address], [place], [place] [address] o [phone] o Mon-Fri 8:00-5:00 . [facility] [place] o [address], [place], [place] [address] o [phone] o Mon-Fri 8:00-5:00 o After hours clinic [facility] [place], [place] [address]) [phone] Mon-Fri 5:00-8:00, Sat 12:00-6:00, Sun 10:00-4:00 o Accepting Medicaid . [facility] Family Medicine at [facility] o [address], [place], [place]  [address] o [phone]   Fax - [phone]  [place] [phone]) . Nature conservation officer at [facility] o [address] [place], [place] [address] o [phone] o Mon-Fri 8:00-5:00 . [facility] - [place] [facility] [place]) o [address] [place], [place] [address] o [phone] o Mon-Thur 8:00-7:00, Fri 8:00-5:00, Sat 8:00-12:00

## 2020-09-03 NOTE — Progress Notes (Signed)
   Location: [facility] Patient: clinic Provider: clinic  PRENATAL INTAKE SUMMARY  Ms. Uhde presents today New OB Nurse Interview.  OB History    Gravida  1   Para      Term      Preterm      AB      Living        SAB      IAB      Ectopic      Multiple      Live Births             I have reviewed the patient's medical, obstetrical, social, and family histories, medications, and available lab results.  SUBJECTIVE She has complaints of bloody show, does not wear panty liner. Home UPT-positive  OBJECTIVE Initial Nurse interview for history (New OB)  EDD: 04/27/21 by LMP GA: [redacted]w[redacted]d G1P0 FHT: not assessed  GENERAL APPEARANCE: alert, well appearing, in no apparent distress, oriented to person, place and time   ASSESSMENT Positive UPT Normal pregnancy  PLAN Prenatal care:  [facility] Labs to be completed at next visit with Raelyn Mora,  CNM 10/02/20 Rx for PNV sent to pharmacy Rx for BP monitor and weight scale sent to [facility] Sign up for Babyscripts Warning signs during pregnancy given   Follow Up Instructions:   I discussed the assessment and treatment plan with the patient. The patient was provided an opportunity to ask questions and all were answered. The patient agreed with the plan and demonstrated an understanding of the instructions.   The patient was advised to call back or seek an in-person evaluation if the symptoms worsen or if the condition fails to improve as anticipated.  I provided 30 minutes of  face-to-face time during this encounter.  Clovis Pu, RN

## 2020-09-10 ENCOUNTER — Other Ambulatory Visit: Payer: Self-pay | Admitting: *Deleted

## 2020-09-10 DIAGNOSIS — O219 Vomiting of pregnancy, unspecified: Secondary | ICD-10-CM

## 2020-09-10 MED ORDER — PROMETHAZINE HCL 25 MG PO TABS: 25.0000 mg | ORAL_TABLET | Freq: Four times a day (QID) | ORAL | 1 refills | Status: DC | PRN

## 2020-10-02 ENCOUNTER — Other Ambulatory Visit (HOSPITAL_COMMUNITY)
Admission: RE | Admit: 2020-10-02 | Discharge: 2020-10-02 | Disposition: A | Discharge status: Home / Self Care | Payer: Medicaid Other | Source: Ambulatory Visit | Attending: Obstetrics and Gynecology | Admitting: Obstetrics and Gynecology

## 2020-10-02 ENCOUNTER — Ambulatory Visit (INDEPENDENT_AMBULATORY_CARE_PROVIDER_SITE_OTHER): Payer: Medicaid Other | Admitting: Obstetrics and Gynecology

## 2020-10-02 ENCOUNTER — Encounter: Payer: Self-pay | Admitting: Obstetrics and Gynecology

## 2020-10-02 ENCOUNTER — Other Ambulatory Visit: Payer: Self-pay

## 2020-10-02 VITALS — BP 132/77 | HR 79 | Temp 98.1°F | Wt 160.8 lb

## 2020-10-02 DIAGNOSIS — Z124 Encounter for screening for malignant neoplasm of cervix: Secondary | ICD-10-CM

## 2020-10-02 DIAGNOSIS — Z3A10 10 weeks gestation of pregnancy: Secondary | ICD-10-CM | POA: Insufficient documentation

## 2020-10-02 DIAGNOSIS — Z3400 Encounter for supervision of normal first pregnancy, unspecified trimester: Secondary | ICD-10-CM

## 2020-10-02 LAB — HEPATITIS C ANTIBODY: HCV Ab: NEGATIVE

## 2020-10-02 NOTE — Progress Notes (Signed)
INITIAL OBSTETRICAL VISIT Patient name: Cindy Welch MRN [id number]  Date of birth: 07-29-1995 Chief Complaint:   Initial Prenatal Visit  History of Present Illness:   Cindy Welch is a 25 y.o. G1P0 African American female at [redacted]w[redacted]d by a certain LMP with an Estimated Date of Delivery: 04/27/21 being seen today for her initial obstetrical visit.  Her obstetrical history is significant for  none . This is an planned pregnancy. She and the father of the baby (FOB) live together, but "not really together like that anymore." She has a support system that consists of her mother/father/friends.. Today she reports no complaints.   Patient's last menstrual period was 07/21/2020 (exact date). Last pap unknown. Review of Systems:   Pertinent items are noted in HPI Denies cramping/contractions, leakage of fluid, vaginal bleeding, abnormal vaginal discharge w/ itching/odor/irritation, headaches, visual changes, shortness of breath, chest pain, abdominal pain, severe nausea/vomiting, or problems with urination or bowel movements unless otherwise stated above.  Pertinent History Reviewed:  Reviewed past medical,surgical, social, obstetrical and family history.  Reviewed problem list, medications and allergies. OB History  Gravida Para Term Preterm AB Living  1            SAB IAB Ectopic Multiple Live Births               # Outcome Date GA Lbr Len/2nd Weight Sex Delivery Anes PTL Lv  1 Current            Physical Assessment:   Vitals:   10/02/20 1037  BP: 132/77  Pulse: 79  Temp: 98.1 F (36.7 C)  Weight: 160 lb 12.8 oz (72.9 kg)  Body mass index is 27.6 kg/m.       Physical Examination:  General appearance - well appearing, and in no distress  Mental status - alert, oriented to person, place, and time  Psych:  She has a normal mood and affect  Skin - warm and dry, normal color, no suspicious lesions noted  Chest - effort normal, all lung fields clear to auscultation  bilaterally  Heart - normal rate and regular rhythm  Abdomen - soft, nontender  Extremities:  No swelling or varicosities noted  Pelvic - VULVA: normal appearing vulva with no masses, tenderness or lesions  VAGINA: normal appearing vagina with normal color and discharge, no lesions.   CERVIX: normal appearing cervix without discharge or lesions, no CMT  Thin prep pap is done with reflex HR HPV cotesting   FHTs by doppler and BS U/S: 170 bpm   Patient informed that the ultrasound is considered a limited OB ultrasound and is not intended to be a complete ultrasound exam.  Patient also informed that the ultrasound is not being completed with the intent of assessing for fetal or placental anomalies or any pelvic abnormalities.  Explained that the purpose of today's ultrasound is to assess for viability.  Baby was found to be viable, active and (+) cardiac activity. Patient acknowledges the purpose of the exam and the limitations of the study.    Assessment & Plan:  1) Low-Risk Pregnancy G1P0 at [redacted]w[redacted]d with an Estimated Date of Delivery: 04/27/21   2) Initial OB visit - Welcomed to practice and introduced self to patient in addition to discussing other advanced practice providers that she may be seeing at this practice - Congratulated patient - Anticipatory guidance on upcoming appointments - Educated on COVID19 and pregnancy and the integration of virtual appointments  - Educated on babyscripts app- patient  reports she has not received email, encouraged to look in spam folder and to call office if she still has not received email - patient verbalizes understanding    3) Supervision of normal first pregnancy, antepartum - Cytology - PAP( [place]) - Cervicovaginal ancillary only( [place]) - Genetic Screening - Obstetric Panel, Including HIV - Hepatitis C antibody - Culture, OB Urine - Hemoglobin A1c  4) Pap smear for cervical cancer screening - Cytology - PAP( [place])  5) [redacted]  weeks gestation of pregnancy    Meds: No orders of the defined types were placed in this encounter.   Initial labs obtained Continue prenatal vitamins Reviewed n/v relief measures and warning s/s to report Reviewed recommended weight gain based on pre-gravid BMI Encouraged well-balanced diet Genetic Screening discussed: ordered Cystic fibrosis, SMA, Fragile X screening discussed ordered The nature of [facility] with multiple MDs and other Advanced Practice Providers was explained to patient; also emphasized that residents, students are part of our team.  Discussed optimized OB schedule and video visits. Advised can have an in-office visit whenever she feels she needs to be seen.  Does not have own BP cuff. BP cuff Rx faxed today. Explained to patient that BP will be mailed to her house. Check BP weekly, let us know if >140/90. Advised to call during normal business hours and there is an after-hours nurse line available.    Follow-up: No follow-ups on file.   Orders Placed This Encounter  Procedures   Culture, OB Urine   Genetic Screening   Obstetric Panel, Including HIV   Hepatitis C antibody   Hemoglobin A1c    Raelyn Mora MSN, CNM 10/02/2020

## 2020-10-03 LAB — OBSTETRIC PANEL, INCLUDING HIV
Antibody Screen: NEGATIVE
Basophils Absolute: 0 10*3/uL (ref 0.0–0.2)
Basos: 0 %
EOS (ABSOLUTE): 0.1 10*3/uL (ref 0.0–0.4)
Eos: 1 %
HIV Screen 4th Generation wRfx: NONREACTIVE
Hematocrit: 33.1 % — ABNORMAL LOW (ref 34.0–46.6)
Hemoglobin: 10.5 g/dL — ABNORMAL LOW (ref 11.1–15.9)
Hepatitis B Surface Ag: NEGATIVE
Immature Grans (Abs): 0 10*3/uL (ref 0.0–0.1)
Immature Granulocytes: 0 %
Lymphocytes Absolute: 2 10*3/uL (ref 0.7–3.1)
Lymphs: 35 %
MCH: 32.5 pg (ref 26.6–33.0)
MCHC: 31.7 g/dL (ref 31.5–35.7)
MCV: 103 fL — ABNORMAL HIGH (ref 79–97)
Monocytes Absolute: 0.2 10*3/uL (ref 0.1–0.9)
Monocytes: 4 %
Neutrophils Absolute: 3.5 10*3/uL (ref 1.4–7.0)
Neutrophils: 60 %
Platelets: 306 10*3/uL (ref 150–450)
RBC: 3.23 x10E6/uL — ABNORMAL LOW (ref 3.77–5.28)
RDW: 11.6 % — ABNORMAL LOW (ref 11.7–15.4)
RPR Ser Ql: NONREACTIVE
Rh Factor: POSITIVE
Rubella Antibodies, IGG: 2.27 index (ref >0.99)
WBC: 5.9 10*3/uL (ref 3.4–10.8)

## 2020-10-03 LAB — CYTOLOGY - PAP: Diagnosis: NEGATIVE

## 2020-10-03 LAB — CERVICOVAGINAL ANCILLARY ONLY
Bacterial Vaginitis (gardnerella): POSITIVE — AB
Candida Glabrata: NEGATIVE
Candida Vaginitis: NEGATIVE
Chlamydia: NEGATIVE
Comment: NEGATIVE
Comment: NEGATIVE
Comment: NEGATIVE
Comment: NEGATIVE
Comment: NEGATIVE
Comment: NORMAL
Neisseria Gonorrhea: NEGATIVE
Trichomonas: POSITIVE — AB

## 2020-10-03 LAB — HEPATITIS C ANTIBODY: Hep C Virus Ab: <0.1 s/co ratio (ref 0.0–0.9)

## 2020-10-03 LAB — HEMOGLOBIN A1C
Est. average glucose Bld gHb Est-mCnc: 80 mg/dL
Hgb A1c MFr Bld: 4.4 % — ABNORMAL LOW (ref 4.8–5.6)

## 2020-10-04 LAB — CULTURE, OB URINE

## 2020-10-04 LAB — URINE CULTURE, OB REFLEX

## 2020-10-07 ENCOUNTER — Other Ambulatory Visit: Payer: Self-pay | Admitting: Obstetrics and Gynecology

## 2020-10-07 ENCOUNTER — Other Ambulatory Visit: Payer: Self-pay | Admitting: *Deleted

## 2020-10-07 DIAGNOSIS — Z3400 Encounter for supervision of normal first pregnancy, unspecified trimester: Secondary | ICD-10-CM

## 2020-10-07 DIAGNOSIS — B9689 Other specified bacterial agents as the cause of diseases classified elsewhere: Secondary | ICD-10-CM

## 2020-10-07 DIAGNOSIS — N760 Acute vaginitis: Secondary | ICD-10-CM

## 2020-10-07 DIAGNOSIS — A599 Trichomoniasis, unspecified: Secondary | ICD-10-CM

## 2020-10-07 MED ORDER — METRONIDAZOLE 500 MG PO TABS: 500.0000 mg | ORAL_TABLET | Freq: Two times a day (BID) | ORAL | 0 refills | Status: DC

## 2020-10-12 ENCOUNTER — Other Ambulatory Visit: Payer: Self-pay | Admitting: Obstetrics and Gynecology

## 2020-10-12 DIAGNOSIS — O99013 Anemia complicating pregnancy, third trimester: Secondary | ICD-10-CM | POA: Insufficient documentation

## 2020-10-12 DIAGNOSIS — O99011 Anemia complicating pregnancy, first trimester: Secondary | ICD-10-CM | POA: Insufficient documentation

## 2020-10-12 MED ORDER — ASCORBIC ACID 500 MG PO TABS: 500.0000 mg | ORAL_TABLET | ORAL | 8 refills | Status: DC

## 2020-10-12 MED ORDER — FERROUS SULFATE 325 (65 FE) MG PO TBEC: 325.0000 mg | DELAYED_RELEASE_TABLET | ORAL | 8 refills | Status: DC

## 2020-10-19 ENCOUNTER — Encounter: Payer: Self-pay | Admitting: Obstetrics and Gynecology

## 2020-10-19 DIAGNOSIS — O285 Abnormal chromosomal and genetic finding on antenatal screening of mother: Secondary | ICD-10-CM

## 2020-10-19 HISTORY — DX: Abnormal chromosomal and genetic finding on antenatal screening of mother: O28.5

## 2020-11-07 ENCOUNTER — Other Ambulatory Visit: Payer: Self-pay

## 2020-11-07 ENCOUNTER — Ambulatory Visit (INDEPENDENT_AMBULATORY_CARE_PROVIDER_SITE_OTHER): Payer: Medicaid Other | Admitting: Obstetrics and Gynecology

## 2020-11-07 ENCOUNTER — Encounter: Payer: Self-pay | Admitting: Obstetrics and Gynecology

## 2020-11-07 ENCOUNTER — Other Ambulatory Visit (HOSPITAL_COMMUNITY)
Admission: RE | Admit: 2020-11-07 | Discharge: 2020-11-07 | Disposition: A | Discharge status: Home / Self Care | Payer: Medicaid Other | Source: Ambulatory Visit | Attending: Obstetrics and Gynecology | Admitting: Obstetrics and Gynecology

## 2020-11-07 VITALS — BP 121/71 | HR 76 | Temp 97.9°F | Wt 157.0 lb

## 2020-11-07 DIAGNOSIS — Z113 Encounter for screening for infections with a predominantly sexual mode of transmission: Secondary | ICD-10-CM | POA: Diagnosis present

## 2020-11-07 DIAGNOSIS — Z3400 Encounter for supervision of normal first pregnancy, unspecified trimester: Secondary | ICD-10-CM

## 2020-11-07 DIAGNOSIS — Z3A15 15 weeks gestation of pregnancy: Secondary | ICD-10-CM

## 2020-11-07 NOTE — Progress Notes (Signed)
   LOW-RISK PREGNANCY OFFICE VISIT Patient name: Cindy Welch MRN [id number]  Date of birth: 11-22-1995 Chief Complaint:   Routine Prenatal Visit  History of Present Illness:   Cindy Welch is a 25 y.o. G1P0 female at [redacted]w[redacted]d with an Estimated Date of Delivery: 04/27/21 being seen today for ongoing management of a low-risk pregnancy.  Today she reports no complaints. Contractions: Not present. Vag. Bleeding: None.  Movement: Present. denies leaking of fluid. Review of Systems:   Pertinent items are noted in HPI Denies abnormal vaginal discharge w/ itching/odor/irritation, headaches, visual changes, shortness of breath, chest pain, abdominal pain, severe nausea/vomiting, or problems with urination or bowel movements unless otherwise stated above. Pertinent History Reviewed:  Reviewed past medical,surgical, social, obstetrical and family history.  Reviewed problem list, medications and allergies. Physical Assessment:   Vitals:   11/07/20 1347  BP: 121/71  Pulse: 76  Temp: 97.9 F (36.6 C)  Weight: 157 lb (71.2 kg)  Body mass index is 26.95 kg/m.        Physical Examination:   General appearance: Well appearing, and in no distress  Mental status: Alert, oriented to person, place, and time  Skin: Warm & dry  Cardiovascular: Normal heart rate noted  Respiratory: Normal respiratory effort, no distress  Abdomen: Soft, gravid, nontender  Pelvic: Cervical exam deferred         Extremities: Edema: None  Fetal Status: Fetal Heart Rate (bpm): 155   Movement: Present    No results found for this or any previous visit (from the past 24 hour(s)).  Assessment & Plan:  1) Low-risk pregnancy G1P0 at [redacted]w[redacted]d with an Estimated Date of Delivery: 04/27/21   2) Supervision of normal first pregnancy, antepartum  - AFP, Serum, Open Spina Bifida,  - [place] MFM OB COMP + 14 WK  3) Screen for STD (sexually transmitted disease)  - Cervicovaginal ancillary only( [place]) - TOC for trichomonas  from 6/22  4) [redacted] weeks gestation of pregnancy   Meds: No orders of the defined types were placed in this encounter.  Labs/procedures today: AFP and TOC for trich  Plan:  Continue routine obstetrical care   Reviewed: Preterm labor symptoms and general obstetric precautions including but not limited to vaginal bleeding, contractions, leaking of fluid and fetal movement were reviewed in detail with the patient.  All questions were answered. Has home bp cuff. Check bp weekly, let us know if >140/90.   Follow-up: Return in about 4 weeks (around 12/05/2020) for Return OB - My Chart video.  Orders Placed This Encounter  Procedures   [place] MFM OB COMP + 14 WK   AFP, Serum, Open Spina Bifida   Raelyn Mora MSN, CNM 11/07/2020 2:01 PM

## 2020-11-09 LAB — AFP, SERUM, OPEN SPINA BIFIDA
AFP MoM: 1.15
AFP Value: 37.5 ng/mL
Gest. Age on Collection Date: 15 weeks
Maternal Age At EDD: 23.6 yr
OSBR Risk 1 IN: 10000
Test Results:: NEGATIVE
Weight: 157 [lb_av]

## 2020-11-11 ENCOUNTER — Other Ambulatory Visit: Payer: Self-pay | Admitting: *Deleted

## 2020-11-11 DIAGNOSIS — Z3400 Encounter for supervision of normal first pregnancy, unspecified trimester: Secondary | ICD-10-CM

## 2020-11-11 DIAGNOSIS — A599 Trichomoniasis, unspecified: Secondary | ICD-10-CM

## 2020-11-11 DIAGNOSIS — N760 Acute vaginitis: Secondary | ICD-10-CM

## 2020-11-11 LAB — CERVICOVAGINAL ANCILLARY ONLY
Bacterial Vaginitis (gardnerella): POSITIVE — AB
Candida Glabrata: NEGATIVE
Candida Vaginitis: NEGATIVE
Chlamydia: NEGATIVE
Comment: NEGATIVE
Comment: NEGATIVE
Comment: NEGATIVE
Comment: NEGATIVE
Comment: NEGATIVE
Comment: NORMAL
Neisseria Gonorrhea: NEGATIVE
Trichomonas: NEGATIVE

## 2020-11-11 MED ORDER — METRONIDAZOLE 500 MG PO TABS: 500.0000 mg | ORAL_TABLET | Freq: Two times a day (BID) | ORAL | 0 refills | Status: DC

## 2020-11-13 ENCOUNTER — Telehealth: Payer: Medicaid Other | Admitting: Advanced Practice Midwife

## 2020-12-04 ENCOUNTER — Ambulatory Visit: Payer: Medicaid Other

## 2020-12-05 ENCOUNTER — Telehealth (INDEPENDENT_AMBULATORY_CARE_PROVIDER_SITE_OTHER): Payer: Medicaid Other | Admitting: Obstetrics and Gynecology

## 2020-12-05 ENCOUNTER — Other Ambulatory Visit: Payer: Self-pay

## 2020-12-05 ENCOUNTER — Encounter: Payer: Self-pay | Admitting: Obstetrics and Gynecology

## 2020-12-05 VITALS — BP 119/75 | HR 90 | Wt 157.8 lb

## 2020-12-05 DIAGNOSIS — O285 Abnormal chromosomal and genetic finding on antenatal screening of mother: Secondary | ICD-10-CM

## 2020-12-05 DIAGNOSIS — Z3A19 19 weeks gestation of pregnancy: Secondary | ICD-10-CM

## 2020-12-05 DIAGNOSIS — Z3400 Encounter for supervision of normal first pregnancy, unspecified trimester: Secondary | ICD-10-CM

## 2020-12-05 NOTE — Progress Notes (Signed)
   MY CHART VIDEO VIRTUAL OBSTETRICS VISIT ENCOUNTER NOTE  I connected with [name] on 12/05/20 at  9:30 AM EDT by My Chart video at home and verified that I am speaking with the correct person using two identifiers. Provider located at [facility] [employer] for [employer] at [place].   I discussed the limitations, risks, security and privacy concerns of performing an evaluation and management service by My Chart video and the availability of in person appointments. I also discussed with the patient that there may be a patient responsible charge related to this service. The patient expressed understanding and agreed to proceed.  I discussed the limitations of telemedicine and the availability of in person appointments.  Discussed there is a possibility of technology failure and discussed alternative modes of communication if that failure occurs.  Subjective:  Cindy Welch is a 25 y.o. G1P0 at [redacted]w[redacted]d being followed for ongoing prenatal care.  She is currently monitored for the following issues for this low-risk pregnancy and has Supervision of normal first pregnancy, antepartum; Anemia affecting pregnancy in first trimester; and Abnormal chromosomal and genetic finding on antenatal screening of mother on their problem list.  Patient reports no complaints. She missed her anatomy U/S appointment scheduled for 12/04/2020. She has not rescheduled that appointment as of yet. Reports fetal movement. Denies any contractions, bleeding or leaking of fluid.   The following portions of the patient's history were reviewed and updated as appropriate: allergies, current medications, past family history, past medical history, past social history, past surgical history and problem list.   Objective:   General:  Alert, oriented and cooperative.   Mental Status: Normal mood and affect perceived. Normal judgment and thought content.  Rest of physical exam deferred due to type of encounter  BP 119/75    Pulse 90   Wt 157 lb 12.8 oz (71.6 kg)   LMP 07/21/2020 (Exact Date)   BMI 27.09 kg/m  **Done by patient's own at home BP cuff and scale  Assessment and Plan:  Pregnancy: G1P0 at [redacted]w[redacted]d  1. Supervision of normal first pregnancy, antepartum - MFM phone number [phone] given for patient to reschedule U/S  - Anticipatory guidance for next appt  2. Abn chromsoml and genetic find on antenat screen of mother - Offered GC and FOB - AMB MFM GENETICS REFERRAL  3. [redacted] weeks gestation of pregnancy   Preterm labor symptoms and general obstetric precautions including but not limited to vaginal bleeding, contractions, leaking of fluid and fetal movement were reviewed in detail with the patient.  I discussed the assessment and treatment plan with the patient. The patient was provided an opportunity to ask questions and all were answered. The patient agreed with the plan and demonstrated an understanding of the instructions. The patient was advised to call back or seek an in-person office evaluation/go to MAU at [facility] for any urgent or concerning symptoms. Please refer to After Visit Summary for other counseling recommendations.   I provided 5 minutes of non-face-to-face time during this encounter. There was 5 minutes of chart review time spent prior to this encounter. Total time spent = 10 minutes.  Return in about 4 weeks (around 01/02/2021) for Return OB - My Chart video.  Future Appointments  Date Time Provider Department Center  12/26/2020 10:35 AM Raelyn Mora, CNM [facility] None    Raelyn Mora, CNM Center for [employer], [facility]

## 2020-12-09 ENCOUNTER — Other Ambulatory Visit: Payer: Self-pay

## 2020-12-09 ENCOUNTER — Encounter (HOSPITAL_COMMUNITY): Payer: Self-pay | Admitting: Obstetrics & Gynecology

## 2020-12-09 ENCOUNTER — Inpatient Hospital Stay (HOSPITAL_COMMUNITY)
Admission: AD | Admit: 2020-12-09 | Discharge: 2020-12-09 | Disposition: A | Discharge status: Home / Self Care | Payer: Medicaid Other | Attending: Family Medicine | Admitting: Family Medicine

## 2020-12-09 DIAGNOSIS — O26852 Spotting complicating pregnancy, second trimester: Secondary | ICD-10-CM | POA: Diagnosis not present

## 2020-12-09 DIAGNOSIS — Z3A20 20 weeks gestation of pregnancy: Secondary | ICD-10-CM | POA: Insufficient documentation

## 2020-12-09 DIAGNOSIS — O209 Hemorrhage in early pregnancy, unspecified: Secondary | ICD-10-CM | POA: Diagnosis present

## 2020-12-09 DIAGNOSIS — Z7722 Contact with and (suspected) exposure to environmental tobacco smoke (acute) (chronic): Secondary | ICD-10-CM | POA: Diagnosis not present

## 2020-12-09 DIAGNOSIS — Z79899 Other long term (current) drug therapy: Secondary | ICD-10-CM | POA: Insufficient documentation

## 2020-12-09 DIAGNOSIS — N939 Abnormal uterine and vaginal bleeding, unspecified: Secondary | ICD-10-CM

## 2020-12-09 LAB — URINALYSIS, ROUTINE W REFLEX MICROSCOPIC
Bacteria, UA: NONE SEEN
Bilirubin Urine: NEGATIVE
Glucose, UA: NEGATIVE mg/dL
Ketones, ur: 80 mg/dL — AB
Leukocytes,Ua: NEGATIVE
Nitrite: NEGATIVE
Protein, ur: 30 mg/dL — AB
Specific Gravity, Urine: 1.027 (ref 1.005–1.030)
pH: 6 (ref 5.0–8.0)

## 2020-12-09 NOTE — MAU Note (Signed)
Cindy Welch is a 25 y.o. at [redacted]w[redacted]d here in MAU reporting: started spotting today, states it stopped about 1.5 hours ago. Saw it mostly when she wiped. No pain. No LOF. No recent IC.  Onset of complaint: today  Pain score: 0/10  Vitals:   12/09/20 1700  BP: 127/77  Pulse: 65  Resp: 16  Temp: 98.6 F (37 C)  SpO2: 98%     FHT:152  Lab orders placed from triage: [facility]

## 2020-12-09 NOTE — MAU Provider Note (Signed)
History     CSN: [id number]  Arrival date and time: 12/09/20 1625   None     Chief Complaint  Patient presents with   Vaginal Bleeding   HPI 25 yo G1P0 at [redacted]w[redacted]d seen for spotting that started this morning. Had one small clot, then mostly spotting. Feels baby move. No cramping or contractions.  OB History     Gravida  1   Para      Term      Preterm      AB      Living         SAB      IAB      Ectopic      Multiple      Live Births              Past Medical History:  Diagnosis Date   Asthma     History reviewed. No pertinent surgical history.  History reviewed. No pertinent family history.  Social History   Tobacco Use   Smoking status: Passive Smoke Exposure - Never Smoker   Smokeless tobacco: Never  Vaping Use   Vaping Use: Never used  Substance Use Topics   Alcohol use: Not Currently   Drug use: Not Currently    Types: Marijuana    Comment: last use 1 month ago    Allergies: No Known Allergies  Medications Prior to Admission  Medication Sig Dispense Refill Last Dose   promethazine (PHENERGAN) 25 MG tablet Take 1 tablet (25 mg total) by mouth every 6 (six) hours as needed for nausea or vomiting. 30 tablet 1 12/08/2020   albuterol (PROVENTIL HFA;VENTOLIN HFA) 108 (90 Base) MCG/ACT inhaler Inhale 1-2 puffs into the lungs every 6 (six) hours as needed for wheezing or shortness of breath.      albuterol (PROVENTIL) (2.5 MG/3ML) 0.083% nebulizer solution Take 3 mLs (2.5 mg total) by nebulization every 6 (six) hours as needed for wheezing or shortness of breath. 75 mL 0    ascorbic acid (VITAMIN C) 500 MG tablet Take 1 tablet (500 mg total) by mouth every other day. Take with iron pill 30 tablet 8    Blood Pressure Monitoring (BLOOD PRESSURE MONITOR AUTOMAT) DEVI 1 Device by Does not apply route daily. Automatic blood pressure cuff regular size. To monitor blood pressure regularly at home. ICD-10 code:Z34.90 1 each 0    ferrous sulfate 325  (65 FE) MG EC tablet Take 1 tablet (325 mg total) by mouth every other day. 30 tablet 8    metroNIDAZOLE (FLAGYL) 500 MG tablet Take 1 tablet (500 mg total) by mouth 2 (two) times daily. (Patient not taking: Reported on 12/05/2020) 14 tablet 0    Misc. Devices (GOJJI WEIGHT SCALE) MISC 1 Device by Does not apply route daily as needed. To weight self daily as needed at home. ICD-10 code: Z34.90 1 each 0    predniSONE (DELTASONE) 20 MG tablet 3 tabs po day one, then 2 po daily x 4 days 11 tablet 0    Prenatal Vit-Fe Phos-FA-Omega (VITAFOL GUMMIES) 3.33-0.333-34.8 MG CHEW Chew 3 each by mouth daily. 90 tablet 12     Review of Systems Physical Exam   Blood pressure 127/77, pulse 65, temperature 98.6 F (37 C), temperature source Oral, resp. rate 16, height 5' 4.5" (1.638 m), weight 68.3 kg, last menstrual period 07/21/2020, SpO2 98 %.  Physical Exam Vitals and nursing note reviewed.  Constitutional:      Appearance: Normal appearance.  Cardiovascular:     Rate and Rhythm: Normal rate.  Pulmonary:     Effort: Pulmonary effort is normal.     Breath sounds: Normal breath sounds.  Abdominal:     General: Abdomen is flat.  Skin:    General: Skin is warm and dry.     Capillary Refill: Capillary refill takes less than 2 seconds.  Neurological:     General: No focal deficit present.     Mental Status: She is alert.  Psychiatric:        Mood and Affect: Mood normal.        Behavior: Behavior normal.    MAU Course  Procedures Limited US done - good fetal movement, posterior placenta. No abruption seen.  MDM  Assessment and Plan   1. [redacted] weeks gestation of pregnancy   2. Vaginal spotting    Patient reassured. Offered vaginal exam, but patient declined. F/u with worsening bleeding.  Levie Heritage 12/09/2020, 6:36 PM

## 2020-12-17 ENCOUNTER — Other Ambulatory Visit: Payer: Self-pay

## 2020-12-17 ENCOUNTER — Other Ambulatory Visit: Payer: Self-pay | Admitting: Obstetrics and Gynecology

## 2020-12-17 ENCOUNTER — Ambulatory Visit: Payer: Medicaid Other | Attending: Obstetrics and Gynecology

## 2020-12-17 DIAGNOSIS — Z363 Encounter for antenatal screening for malformations: Secondary | ICD-10-CM | POA: Diagnosis not present

## 2020-12-17 DIAGNOSIS — Z3689 Encounter for other specified antenatal screening: Secondary | ICD-10-CM

## 2020-12-17 DIAGNOSIS — O285 Abnormal chromosomal and genetic finding on antenatal screening of mother: Secondary | ICD-10-CM

## 2020-12-17 DIAGNOSIS — Z3A21 21 weeks gestation of pregnancy: Secondary | ICD-10-CM

## 2020-12-17 DIAGNOSIS — O358XX0 Maternal care for other (suspected) fetal abnormality and damage, not applicable or unspecified: Secondary | ICD-10-CM | POA: Insufficient documentation

## 2020-12-17 DIAGNOSIS — Z3400 Encounter for supervision of normal first pregnancy, unspecified trimester: Secondary | ICD-10-CM

## 2020-12-17 DIAGNOSIS — Z148 Genetic carrier of other disease: Secondary | ICD-10-CM | POA: Diagnosis not present

## 2020-12-18 ENCOUNTER — Ambulatory Visit: Payer: Medicaid Other | Attending: Obstetrics and Gynecology

## 2020-12-18 DIAGNOSIS — O285 Abnormal chromosomal and genetic finding on antenatal screening of mother: Secondary | ICD-10-CM | POA: Diagnosis not present

## 2020-12-18 NOTE — Progress Notes (Signed)
Virtual Visit via Video Note  I connected with Cindy Welch on 12/18/20 at  9:00 AM EDT by a video enabled telemedicine application and verified that I am speaking with the correct person using two identifiers.  Location: Patient: home Provider: [facility]   I discussed the limitations of evaluation and management by telemedicine and the availability of in person appointments. The patient expressed understanding and agreed to proceed.  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  We reviewed those results as well as additional screening and testing options for this pregnancy. This meeting was held via Webex and the patient was alone for the visit.    SMA is a recessive genetic condition with variable age of onset and severity caused by mutations in the SMN1 gene.  To review, a recessive condition occurs in a child when both the mother and the father are carriers of the genetic condition and both pass on that altered gene to the child.  The features of SMA are caused by the loss of motor neurons which leads to progressive muscle weakness and muscle wasting (atrophy) in infancy.  There are several types of SMA based upon severity, but in the most common type (type 1), children may pass away as early as 39 years of age.  Carrier testing assesses the number of copies of the SMN1 gene, from zero to four.  Persons with one copy of the SMN1 gene are carriers, and those with no copies are affected with the condition.  Individuals with two or more copies have a reduced chance to be a carrier.  Persons with 3 or 4 copies of this gene are highly unlikely to be carriers.  However, someone with two copies of the gene can either have two copies of this gene on separate chromosomes and thus not be a carrier or they could have two copies of this gene on the same  chromosome with no copies on the other chromosome and be a "silent" carrier.  This silent carrier status is known to be more common in persons of African American or Ashkenazi Jewish ancestry, particularly when they are found to be positive for a variant in the gene known as c. *3+80T>G. 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 This means that there is a 97% chance that she is not a carrier and a 3% chance that she is a carrier.  This testing cannot confirm or eliminate the chance to have a child with SMA. It is estimated that current carrier screening can detect 94.8% of carriers in the Caucasian population and 70.5% of carriers in the African American population.    Then next option we reviewed is to have her partner tested to determine his status.  If he is found to be a carrier, then the pregnancy may be at increased risk for SMA and testing would be offered during the pregnancy through amniocentesis or at the time of birth. At this time, prior to testing, the father of the baby would have a chance of 1 in 63 to 1 in 68 depending upon his ethnic background, which the patient was unsure of.  Overall, the chance for this baby to be affected with SMA at  this time is 1 in 5,440- 1 in 8,160. If he were to have testing that showed a low chance for him to be a carrier, this number could be decreased significantly.  If his testing were to show that he is a carrier, then the number would be increased.   We also talked about the possibility of testing at the time of delivery. Beginning in 2021, all newborn babies in [place] are test for SMA as well as numerous other conditions as part of state mandated newborn screening. Testing on cord blood or of the baby sometime after delivery could also be ordered if desired.  Lastly, we reviewed the results of the other testing performed by her  OB for screening.  The results of the CF and hemoglobinopathies were negative, thus greatly reducing the chance to have a child with these conditions.  Panorama cell free fetal DNA testing was also negative. This means that the chance for Down syndrome, trisomy 23, trisomy 77, triploidy and monosomy X as well as 22q deletion syndrome is less that 1 in 10,000.  We also obtained a detailed family history and pregnancy history.  This is the first pregnancy for Cindy Welch.  The reported light spotting early in the pregnancy but no other complications and no exposure to medications, tobacco, alcohol or recreational drugs. She and several family members are reported to have asthma.  We reviewed that this condition likely has genetic as well as environmental factors, but that no current genetic testing is used to predict who may develop asthma. The remainder of the family history was unremarkable for birth defects, intellectual disabilities, muscle weakness conditions, early death or known genetic conditions.  Lastly, we talked about the [facility] that [facility]  The fetal growth and anatomy were normal except for the presence of an echogenic intracardiac focus.  An echogenic intracardiac focus (EIF) is a bright spot in the heart.  They are usually found in the left ventricle, which is one of the lower chambers of the heart.  This finding is thought to occur in at least 3-5% of second trimester ultrasounds as a result of micro-calcifications in the papillary muscle of the heart.  While it is most likely a normal variation, some studies have found an association between this finding and chromosome abnormalities.  The current data suggests that as an isolated ultrasound finding, the chance for a chromosome problem is not expected to be higher than the risk from an amniocentesis (1 in 500).  This is particularly thought to be true in women under the age of 35 years or who have had  normal results from cell free fetal DNA testing.  Thank you for allowing [place] to be involved in the care of this patient.  We encouraged her to call with any questions or concerns as they arise.  We may be reached at [phone].  Plan of care: The patient declined to pursue carrier testing on the father of the pregnancy.  She will follow up with newborn screening results at the time of delivery. She also declined amniocentesis as an additional way to assess for chromosome conditions given the EIF seen on anatomy ultrasound.  Cherly Anderson, MS, CGC  I provided 28 minutes of non-face-to-face time during this encounter.   Katrina Stack

## 2020-12-26 ENCOUNTER — Ambulatory Visit (INDEPENDENT_AMBULATORY_CARE_PROVIDER_SITE_OTHER): Payer: Medicaid Other | Admitting: Obstetrics and Gynecology

## 2020-12-26 ENCOUNTER — Other Ambulatory Visit: Payer: Self-pay

## 2020-12-26 ENCOUNTER — Encounter: Payer: Self-pay | Admitting: Obstetrics and Gynecology

## 2020-12-26 VITALS — BP 116/74 | HR 90 | Temp 98.0°F | Wt 150.2 lb

## 2020-12-26 DIAGNOSIS — O210 Mild hyperemesis gravidarum: Secondary | ICD-10-CM

## 2020-12-26 DIAGNOSIS — Z3400 Encounter for supervision of normal first pregnancy, unspecified trimester: Secondary | ICD-10-CM

## 2020-12-26 DIAGNOSIS — Z3A22 22 weeks gestation of pregnancy: Secondary | ICD-10-CM

## 2020-12-26 DIAGNOSIS — R634 Abnormal weight loss: Secondary | ICD-10-CM

## 2020-12-26 MED ORDER — ONDANSETRON 8 MG PO TBDP: 8.0000 mg | ORAL_TABLET | Freq: Three times a day (TID) | ORAL | 3 refills | Status: DC | PRN

## 2020-12-26 MED ORDER — ONDANSETRON 4 MG PO TBDP
8.0000 mg | ORAL_TABLET | Freq: Once | ORAL | Status: AC
  Administered 2020-12-26: 8 mg via ORAL

## 2020-12-26 NOTE — Progress Notes (Signed)
   LOW-RISK PREGNANCY OFFICE VISIT Patient name: Cindy Welch MRN [id number]  Date of birth: Feb 12, 1996 Chief Complaint:   Routine Prenatal Visit  History of Present Illness:   Cindy Welch is a 25 y.o. G1P0 female at [redacted]w[redacted]d with an Estimated Date of Delivery: 04/27/21 being seen today for ongoing management of a low-risk pregnancy.  Today she reports fatigue, nausea, vomiting, and taking Phenergan without relief .She reports still vomiting 2-3 times a day. Contractions: Not present. Vag. Bleeding: None.  Movement: Present. denies leaking of fluid. Review of Systems:   Pertinent items are noted in HPI Denies abnormal vaginal discharge w/ itching/odor/irritation, headaches, visual changes, shortness of breath, chest pain, abdominal pain, severe nausea/vomiting, or problems with urination or bowel movements unless otherwise stated above. Pertinent History Reviewed:  Reviewed past medical,surgical, social, obstetrical and family history.  Reviewed problem list, medications and allergies. Physical Assessment:   Vitals:   12/26/20 1017  BP: 116/74  Pulse: 90  Temp: 98 F (36.7 C)  Weight: 150 lb 3.2 oz (68.1 kg)  Body mass index is 25.38 kg/m.        Physical Examination:   General appearance: Well appearing, and in no distress  Mental status: Alert, oriented to person, place, and time  Skin: Warm & dry  Cardiovascular: Normal heart rate noted  Respiratory: Normal respiratory effort, no distress  Abdomen: Soft, gravid, nontender  Pelvic: Cervical exam deferred         Extremities: Edema: None  Fetal Status: Fetal Heart Rate (bpm): 145 Fundal Height: 22 cm Movement: Present    No results found for this or any previous visit (from the past 24 hour(s)).  Assessment & Plan:  1) Low-risk pregnancy G1P0 at [redacted]w[redacted]d with an Estimated Date of Delivery: 04/27/21   2) Supervision of normal first pregnancy, antepartum  3) Hyperemesis gravidarum, mild, before 23rd week  - Rx for  ondansetron (ZOFRAN-ODT) disintegrating tablet 8 mg,  - ondansetron (ZOFRAN ODT) 8 MG disintegrating tablet given in the clinic before leaving  4) [redacted] weeks gestation of pregnancy  5) Weight loss, unintentional - 7# weight loss since 12/05/20 visit  Meds:  Meds ordered this encounter  Medications   ondansetron (ZOFRAN-ODT) disintegrating tablet 8 mg   ondansetron (ZOFRAN ODT) 8 MG disintegrating tablet    Sig: Take 1 tablet (8 mg total) by mouth every 8 (eight) hours as needed for nausea or vomiting.    Dispense:  30 tablet    Refill:  3    Order Specific Question:   Supervising Provider    Answer:   Reva Bores [[id number]]   Labs/procedures today: none  Plan:  Continue routine obstetrical care   Reviewed: Preterm labor symptoms and general obstetric precautions including but not limited to vaginal bleeding, contractions, leaking of fluid and fetal movement were reviewed in detail with the patient.  All questions were answered. Has home bp cuff. Check bp weekly, let us know if >140/90.   Follow-up: Return in about 2 weeks (around 01/09/2021) for weight check.  No orders of the defined types were placed in this encounter.  Raelyn Mora MSN, CNM 12/26/2020 10:31 AM

## 2021-01-07 ENCOUNTER — Ambulatory Visit: Payer: Self-pay

## 2021-01-09 ENCOUNTER — Other Ambulatory Visit: Payer: Self-pay

## 2021-01-09 ENCOUNTER — Ambulatory Visit: Payer: Medicaid Other

## 2021-01-09 VITALS — BP 123/64 | HR 77 | Wt 156.0 lb

## 2021-01-09 DIAGNOSIS — R634 Abnormal weight loss: Secondary | ICD-10-CM

## 2021-01-09 NOTE — Progress Notes (Signed)
Patient presented to the office today for a weight check. Patient was seen on 12/26/2020 for vomiting and weight loss. Patient advised to come back in a week for a weight check.  Blood Pressure:  within normal range. Current weight : 156  Weight:  12/26/2020-150  Per R.Dawson is stable at this time. Follow up: 02/06/2021

## 2021-02-06 ENCOUNTER — Other Ambulatory Visit: Payer: Self-pay

## 2021-02-06 ENCOUNTER — Ambulatory Visit (INDEPENDENT_AMBULATORY_CARE_PROVIDER_SITE_OTHER): Payer: Medicaid Other | Admitting: Obstetrics and Gynecology

## 2021-02-06 VITALS — BP 111/80 | HR 79 | Temp 97.6°F | Wt 154.0 lb

## 2021-02-06 DIAGNOSIS — Z23 Encounter for immunization: Secondary | ICD-10-CM | POA: Diagnosis not present

## 2021-02-06 DIAGNOSIS — Z3400 Encounter for supervision of normal first pregnancy, unspecified trimester: Secondary | ICD-10-CM | POA: Diagnosis not present

## 2021-02-06 DIAGNOSIS — Z3A28 28 weeks gestation of pregnancy: Secondary | ICD-10-CM | POA: Diagnosis not present

## 2021-02-06 DIAGNOSIS — O99011 Anemia complicating pregnancy, first trimester: Secondary | ICD-10-CM

## 2021-02-06 NOTE — Progress Notes (Signed)
   LOW-RISK PREGNANCY OFFICE VISIT Patient name: Cindy Welch MRN [id number]  Date of birth: 1996-01-06 Chief Complaint:   Routine Prenatal Visit  History of Present Illness:   Cindy Welch is a 25 y.o. G1P0 female at [redacted]w[redacted]d with an Estimated Date of Delivery: 04/27/21 being seen today for ongoing management of a low-risk pregnancy.  Today she reports nausea and vomiting. Contractions: Not present. Vag. Bleeding: None.  Movement: Present. denies leaking of fluid. Review of Systems:   Pertinent items are noted in HPI Denies abnormal vaginal discharge w/ itching/odor/irritation, headaches, visual changes, shortness of breath, chest pain, abdominal pain, severe nausea/vomiting, or problems with urination or bowel movements unless otherwise stated above. Pertinent History Reviewed:  Reviewed past medical,surgical, social, obstetrical and family history.  Reviewed problem list, medications and allergies. Physical Assessment:   Vitals:   02/06/21 0825  BP: 111/80  Pulse: 79  Temp: 97.6 F (36.4 C)  Weight: 154 lb (69.9 kg)  Body mass index is 26.03 kg/m.        Physical Examination:   General appearance: Well appearing, and in no distress  Mental status: Alert, oriented to person, place, and time  Skin: Warm & dry  Cardiovascular: Normal heart rate noted  Respiratory: Normal respiratory effort, no distress  Abdomen: Soft, gravid, nontender  Pelvic: Cervical exam deferred         Extremities: Edema: None  Fetal Status: Fetal Heart Rate (bpm): 131 Fundal Height: 28 cm Movement: Present    No results found for this or any previous visit (from the past 24 hour(s)).  Assessment & Plan:  1) Low-risk pregnancy G1P0 at [redacted]w[redacted]d with an Estimated Date of Delivery: 04/27/21   2) Supervision of normal first pregnancy, antepartum  - Glucose Tolerance, 2 Hours w/1 Hour,  - RPR,  - CBC,  - HIV Antibody (routine testing w rflx),  - Tdap vaccine greater than or equal to 7yo IM -  Gaining weight appropriately - Explained r/b of receiving flu vaccine in pregnancy -- will consider later  3) Anemia affecting pregnancy in first trimester  - Taking iron and vitamin C QOD as prescribed  4) [redacted] weeks gestation of pregnancy     Meds: No orders of the defined types were placed in this encounter.  Labs/procedures today: 2 hr GTT, 3rd trimester labs and TdaP  Plan:  Continue routine obstetrical care   Reviewed: Preterm labor symptoms and general obstetric precautions including but not limited to vaginal bleeding, contractions, leaking of fluid and fetal movement were reviewed in detail with the patient.  All questions were answered. Has home bp cuff. Check bp weekly, let us know if >140/90.   Follow-up: Return in about 4 weeks (around 03/06/2021) for Return OB - My Chart video.  Orders Placed This Encounter  Procedures   Tdap vaccine greater than or equal to 7yo IM   Glucose Tolerance, 2 Hours w/1 Hour   RPR   CBC   HIV Antibody (routine testing w rflx)   Raelyn Mora MSN, CNM 02/06/2021 8:34 AM

## 2021-02-06 NOTE — Patient Instructions (Signed)
Iron-Rich Diet Iron is a mineral that helps your body produce hemoglobin. Hemoglobin is a protein in red blood cells that carries oxygen to your body's tissues. Eating too little iron may cause you to feel weak and tired, and it can increase your risk of infection. Iron is naturally found in many foods, and many foods have iron added to them (are iron-fortified). You may need to follow an iron-rich diet if you do not have enough iron in your body due to certain medical conditions. The amount of iron that you need each day depends on your age, your sex, and any medical conditions you have. Follow instructions from your health care provider or a dietitian about how much iron you should eat each day. What are tips for following this plan? Reading food labels Check food labels to see how many milligrams (mg) of iron are in each serving. Cooking Cook foods in pots and pans that are made from iron. Take these steps to make it easier for your body to absorb iron from certain foods: Soak beans overnight before cooking. Soak whole grains overnight and drain them before using. Ferment flours before baking, such as by using yeast in bread dough. Meal planning When you eat foods that contain iron, you should eat them with foods that are high in vitamin C. These include oranges, peppers, tomatoes, potatoes, and mangoes. Vitamin C helps your body absorb iron. Certain foods and drinks prevent your body from absorbing iron properly. Avoid eating these foods in the same meal as iron-rich foods or with iron supplements. These foods include: Coffee, black tea, and red wine. Milk, dairy products, and foods that are high in calcium. Beans and soybeans. Whole grains. General information Take iron supplements only as told by your health care provider. An overdose of iron can be life-threatening. If you were prescribed iron supplements, take them with orange juice or a vitamin C supplement. When you eat iron-fortified  foods or take an iron supplement, you should also eat foods that naturally contain iron, such as meat, poultry, and fish. Eating naturally iron-rich foods helps your body absorb the iron that is added to other foods or contained in a supplement. Iron from animal sources is better absorbed than iron from plant sources. What foods should I eat? Fruits Prunes. Raisins. Eat fruits high in vitamin C, such as oranges, grapefruits, and strawberries, with iron-rich foods. Vegetables Spinach (cooked). Green peas. Broccoli. Fermented vegetables. Eat vegetables high in vitamin C, such as leafy greens, potatoes, bell peppers, and tomatoes, with iron-rich foods. Grains Iron-fortified breakfast cereal. Iron-fortified whole-wheat bread. Enriched rice. Sprouted grains. Meats and other proteins Beef liver. Beef. [place]. Chicken. Oysters. Shrimp. [place]. Sardines. Chickpeas. Nuts. Tofu. Pumpkin seeds. Beverages Tomato juice. Fresh orange juice. Prune juice. Hibiscus tea. Iron-fortified instant breakfast shakes. Sweets and desserts Blackstrap molasses. Seasonings and condiments Tahini. Fermented soy sauce. Other foods Wheat germ. The items listed above may not be a complete list of recommended foods and beverages. Contact a dietitian for more information. What foods should I limit? These are foods that should be limited while eating iron-rich foods as they can reduce the absorption of iron in your body. Grains Whole grains. Bran cereal. Bran flour. Meats and other proteins Soybeans. Products made from soy protein. Black beans. Lentils. Mung beans. Split peas. Dairy Milk. Cream. Cheese. Yogurt. Cottage cheese. Beverages Coffee. Black tea. Red wine. Sweets and desserts Cocoa. Chocolate. Ice cream. Seasonings and condiments Basil. Oregano. Large amounts of parsley. The items listed above  may not be a complete list of foods and beverages you should limit. Contact a dietitian for more  information. Summary Iron is a mineral that helps your body produce hemoglobin. Hemoglobin is a protein in red blood cells that carries oxygen to your body's tissues. Iron is naturally found in many foods, and many foods have iron added to them (are iron-fortified). When you eat foods that contain iron, you should eat them with foods that are high in vitamin C. Vitamin C helps your body absorb iron. Certain foods and drinks prevent your body from absorbing iron properly, such as whole grains and dairy products. You should avoid eating these foods in the same meal as iron-rich foods or with iron supplements. This information is not intended to replace advice given to you by your health care provider. Make sure you discuss any questions you have with your health care provider. Document Revised: 03/11/2020 Document Reviewed: 03/11/2020 Elsevier Patient Education  2022 [employer]  Fetal Movement Counts Patient Name: ________________________________________________ Patient Due Date: ____________________ What is a fetal movement count? A fetal movement count is the number of times that you feel your baby move during a certain amount of time. This may also be called a fetal kick count. A fetal movement count is recommended for every pregnant woman. You may be asked to start counting fetal movements as early as week 28 of your pregnancy. Pay attention to when your baby is most active. You may notice your baby's sleep and wake cycles. You may also notice things that make your baby move more. You should do a fetal movement count: When your baby is normally most active. At the same time each day. A good time to count movements is while you are resting, after having something to eat and drink. How do I count fetal movements? Find a quiet, comfortable area. Sit, or lie down on your side. Write down the date, the start time and stop time, and the number of movements that you felt between those two times.  Take this information with you to your health care visits. Write down your start time when you feel the first movement. Count kicks, flutters, swishes, rolls, and jabs. You should feel at least 10 movements. You may stop counting after you have felt 10 movements, or if you have been counting for 2 hours. Write down the stop time. If you do not feel 10 movements in 2 hours, contact your health care provider for further instructions. Your health care provider may want to do additional tests to assess your baby's well-being. Contact a health care provider if: You feel fewer than 10 movements in 2 hours. Your baby is not moving like he or she usually does. Date: ____________ Start time: ____________ Stop time: ____________ Movements: ____________ Date: ____________ Start time: ____________ Stop time: ____________ Movements: ____________ Date: ____________ Start time: ____________ Stop time: ____________ Movements: ____________ Date: ____________ Start time: ____________ Stop time: ____________ Movements: ____________ Date: ____________ Start time: ____________ Stop time: ____________ Movements: ____________ Date: ____________ Start time: ____________ Stop time: ____________ Movements: ____________ Date: ____________ Start time: ____________ Stop time: ____________ Movements: ____________ Date: ____________ Start time: ____________ Stop time: ____________ Movements: ____________ Date: ____________ Start time: ____________ Stop time: ____________ Movements: ____________ This information is not intended to replace advice given to you by your health care provider. Make sure you discuss any questions you have with your health care provider. Document Revised: 11/17/2018 Document Reviewed: 11/17/2018 Elsevier Patient Education  2022 [employer].  Third  of Pregnancy The third trimester of pregnancy is from week 28 through week 40. This is months 7 through 9. The third trimester is a time when the  unborn baby (fetus) is growing rapidly. At the end of the ninth month, the fetus is about 20 inches long and weighs 6-10 pounds. Body changes during your third trimester During the third trimester, your body will continue to go through many changes. The changes vary and generally return to normal after your baby is born. Physical changes Your weight will continue to increase. You can expect to gain 25-35 pounds (11-16 kg) by the end of the pregnancy if you begin pregnancy at a normal weight. If you are underweight, you can expect to gain 28-40 lb (about 13-18 kg), and if you are overweight, you can expect to gain 15-25 lb (about 7-11 kg). You may begin to get stretch marks on your hips, abdomen, and breasts. Your breasts will continue to grow and may hurt. A yellow fluid (colostrum) may leak from your breasts. This is the first milk you are producing for your baby. You may have changes in your hair. These can include thickening of your hair, rapid growth, and changes in texture. Some people also have hair loss during or after pregnancy, or hair that feels dry or thin. Your belly button may stick out. You may notice more swelling in your hands, face, or ankles. Health changes You may have heartburn. You may have constipation. You may develop hemorrhoids. You may develop swollen, bulging veins (varicose veins) in your legs. You may have increased body aches in the pelvis, back, or thighs. This is due to weight gain and increased hormones that are relaxing your joints. You may have increased tingling or numbness in your hands, arms, and legs. The skin on your abdomen may also feel numb. You may feel short of breath because of your expanding uterus. Other changes You may urinate more often because the fetus is moving lower into your pelvis and pressing on your bladder. You may have more problems sleeping. This may be caused by the size of your abdomen, an increased need to urinate, and an increase in  your body's metabolism. You may notice the fetus "dropping," or moving lower in your abdomen (lightening). You may have increased vaginal discharge. You may notice that you have pain around your pelvic bone as your uterus distends. Follow these instructions at home: Medicines Follow your health care provider's instructions regarding medicine use. Specific medicines may be either safe or unsafe to take during pregnancy. Do not take any medicines unless approved by your health care provider. Take a prenatal vitamin that contains at least 600 micrograms (mcg) of folic acid. Eating and drinking Eat a healthy diet that includes fresh fruits and vegetables, whole grains, good sources of protein such as meat, eggs, or tofu, and low-fat dairy products. Avoid raw meat and unpasteurized juice, milk, and cheese. These carry germs that can harm you and your baby. Eat 4 or 5 small meals rather than 3 large meals a day. You may need to take these actions to prevent or treat constipation: Drink enough fluid to keep your urine pale yellow. Eat foods that are high in fiber, such as beans, whole grains, and fresh fruits and vegetables. Limit foods that are high in fat and processed sugars, such as fried or sweet foods. Activity Exercise only as directed by your health care provider. Most people can continue their usual exercise routine during pregnancy. Try to exercise   for 30 minutes at least 5 days a week. Stop exercising if you experience contractions in the uterus. Stop exercising if you develop pain or cramping in the lower abdomen or lower back. Avoid heavy lifting. Do not exercise if it is very hot or humid or if you are at a high altitude. If you choose to, you may continue to have sex unless your health care provider tells you not to. Relieving pain and discomfort Take frequent breaks and rest with your legs raised (elevated) if you have leg cramps or low back pain. Take warm sitz baths to soothe any  pain or discomfort caused by hemorrhoids. Use hemorrhoid cream if your health care provider approves. Wear a supportive bra to prevent discomfort from breast tenderness. If you develop varicose veins: Wear support hose as told by your health care provider. Elevate your feet for 15 minutes, 3-4 times a day. Limit salt in your diet. Safety Talk to your health care provider before traveling far distances. Do not use hot tubs, steam rooms, or saunas. Wear your seat belt at all times when driving or riding in a car. Talk with your health care provider if someone is verbally or physically abusive to you. Preparing for birth To prepare for the arrival of your baby: Take prenatal classes to understand, practice, and ask questions about labor and delivery. Visit the hospital and tour the maternity area. Purchase a rear-facing car seat and make sure you know how to install it in your car. Prepare the baby's room or sleeping area. Make sure to remove all pillows and stuffed animals from the baby's crib to prevent suffocation. General instructions Avoid cat litter boxes and soil used by cats. These carry germs that can cause birth defects in the baby. If you have a cat, ask someone to clean the litter box for you. Do not douche or use tampons. Do not use scented sanitary pads. Do not use any products that contain nicotine or tobacco, such as cigarettes, e-cigarettes, and chewing tobacco. If you need help quitting, ask your health care provider. Do not use any herbal remedies, illegal drugs, or medicines that were not prescribed to you. Chemicals in these products can harm your baby. Do not drink alcohol. You will have more frequent prenatal exams during the third trimester. During a routine prenatal visit, your health care provider will do a physical exam, perform tests, and discuss your overall health. Keep all follow-up visits. This is important. Where to find more information American Pregnancy  Association: americanpregnancy.org American College of Obstetricians and Gynecologists: acog.org/en/Womens%20Health/Pregnancy Office on Women's Health: womenshealth.gov/pregnancy Contact a health care provider if you have: A fever. Mild pelvic cramps, pelvic pressure, or nagging pain in your abdominal area or lower back. Vomiting or diarrhea. Bad-smelling vaginal discharge or foul-smelling urine. Pain when you urinate. A headache that does not go away when you take medicine. Visual changes or see spots in front of your eyes. Get help right away if: Your water breaks. You have regular contractions less than 5 minutes apart. You have spotting or bleeding from your vagina. You have severe abdominal pain. You have difficulty breathing. You have chest pain. You have fainting spells. You have not felt your baby move for the time period told by your health care provider. You have new or increased pain, swelling, or redness in an arm or leg. Summary The third trimester of pregnancy is from week 28 through week 40 (months 7 through 9). You may have more problems sleeping. This   This can be caused by the size of your abdomen, an increased need to urinate, and an increase in your body's metabolism. You will have more frequent prenatal exams during the third trimester. Keep all follow-up visits. This is important. This information is not intended to replace advice given to you by your health care provider. Make sure you discuss any questions you have with your health care provider. Document Revised: 09/06/2019 Document Reviewed: 07/13/2019 Elsevier Patient Education  2022 [employer].

## 2021-02-07 LAB — GLUCOSE TOLERANCE, 2 HOURS W/ 1HR
Glucose, 1 hour: 156 mg/dL (ref 70–179)
Glucose, 2 hour: 117 mg/dL (ref 70–152)
Glucose, Fasting: 82 mg/dL (ref 70–91)

## 2021-02-07 LAB — CBC
Hematocrit: 27.3 % — ABNORMAL LOW (ref 34.0–46.6)
Hemoglobin: 9.2 g/dL — ABNORMAL LOW (ref 11.1–15.9)
MCH: 33.6 pg — ABNORMAL HIGH (ref 26.6–33.0)
MCHC: 33.7 g/dL (ref 31.5–35.7)
MCV: 100 fL — ABNORMAL HIGH (ref 79–97)
Platelets: 251 10*3/uL (ref 150–450)
RBC: 2.74 x10E6/uL — CL (ref 3.77–5.28)
RDW: 10.7 % — ABNORMAL LOW (ref 11.7–15.4)
WBC: 5.6 10*3/uL (ref 3.4–10.8)

## 2021-02-07 LAB — RPR: RPR Ser Ql: NONREACTIVE

## 2021-02-07 LAB — HIV ANTIBODY (ROUTINE TESTING W REFLEX): HIV Screen 4th Generation wRfx: NONREACTIVE

## 2021-03-05 ENCOUNTER — Other Ambulatory Visit: Payer: Self-pay

## 2021-03-05 ENCOUNTER — Telehealth (INDEPENDENT_AMBULATORY_CARE_PROVIDER_SITE_OTHER): Payer: Medicaid Other | Admitting: Advanced Practice Midwife

## 2021-03-05 VITALS — BP 136/79 | HR 73 | Wt 154.6 lb

## 2021-03-05 DIAGNOSIS — Z3400 Encounter for supervision of normal first pregnancy, unspecified trimester: Secondary | ICD-10-CM

## 2021-03-05 DIAGNOSIS — Z3A32 32 weeks gestation of pregnancy: Secondary | ICD-10-CM

## 2021-03-05 DIAGNOSIS — O4703 False labor before 37 completed weeks of gestation, third trimester: Secondary | ICD-10-CM

## 2021-03-05 DIAGNOSIS — O479 False labor, unspecified: Secondary | ICD-10-CM

## 2021-03-05 NOTE — Progress Notes (Signed)
   OBSTETRICS PRENATAL VIRTUAL VISIT ENCOUNTER NOTE  Provider location: [facility]   Patient location: Home  I connected with Cindy Welch on 03/05/21 at  9:35 AM EST by MyChart Video Encounter and verified that I am speaking with the correct person using two identifiers. I discussed the limitations, risks, security and privacy concerns of performing an evaluation and management service virtually and the availability of in person appointments. I also discussed with the patient that there may be a patient responsible charge related to this service. The patient expressed understanding and agreed to proceed. Subjective:  Cindy Welch is a 25 y.o. G1P0 at [redacted]w[redacted]d being seen today for ongoing prenatal care.  She is currently monitored for the following issues for this low-risk pregnancy and has Supervision of normal first pregnancy, antepartum; Anemia affecting pregnancy in first trimester; and Abnormal chromosomal and genetic finding on antenatal screening of mother on their problem list.  Patient reports occasional contractions.  Contractions: Irritability. Vag. Bleeding: None.  Movement: Present. Denies any leaking of fluid.   The following portions of the patient's history were reviewed and updated as appropriate: allergies, current medications, past family history, past medical history, past social history, past surgical history and problem list.   Objective:   Vitals:   03/05/21 0929  BP: 136/79  Pulse: 73  Weight: 154 lb 9.6 oz (70.1 kg)    Fetal Status:     Movement: Present     General:  Alert, oriented and cooperative. Patient is in no acute distress.  Respiratory: Normal respiratory effort, no problems with respiration noted  Mental Status: Normal mood and affect. Normal behavior. Normal judgment and thought content.  Rest of physical exam deferred due to type of encounter  Imaging: No results found.  Assessment and Plan:   Pregnancy: G1P0 at [redacted]w[redacted]d 1. Supervision of normal first pregnancy, antepartum --Pt reports good fetal movement, denies cramping, LOF, or vaginal bleeding --Anticipatory guidance about next visits/weeks of pregnancy given. --Next visit in 2 weeks  2. [redacted] weeks gestation of pregnancy   3. Braxton Hicks contractions --Pt reports irregular cramping --Rest/ice/heat/warm bath/increase PO fluids/Tylenol/pregnancy support belt --Reviewed s/sx of PTL, reasons to seek care, and where to go   Preterm labor symptoms and general obstetric precautions including but not limited to vaginal bleeding, contractions, leaking of fluid and fetal movement were reviewed in detail with the patient. I discussed the assessment and treatment plan with the patient. The patient was provided an opportunity to ask questions and all were answered. The patient agreed with the plan and demonstrated an understanding of the instructions. The patient was advised to call back or seek an in-person office evaluation/go to [facility] at [facility] for any urgent or concerning symptoms. Please refer to After Visit Summary for other counseling recommendations.   I provided 5 minutes of face-to-face time during this encounter.  Return in about 2 weeks (around 03/19/2021).  Future Appointments  Date Time Provider Department Center  04/03/2021  9:35 AM Raelyn Mora, CNM [facility] None  04/10/2021 10:35 AM Raelyn Mora, CNM [facility] None    Sharen Counter, CNM [facility] [employer]

## 2021-04-03 ENCOUNTER — Other Ambulatory Visit: Payer: Self-pay

## 2021-04-03 ENCOUNTER — Other Ambulatory Visit (HOSPITAL_COMMUNITY)
Admission: RE | Admit: 2021-04-03 | Discharge: 2021-04-03 | Disposition: A | Discharge status: Home / Self Care | Payer: Medicaid Other | Source: Ambulatory Visit | Attending: Obstetrics and Gynecology | Admitting: Obstetrics and Gynecology

## 2021-04-03 ENCOUNTER — Ambulatory Visit (INDEPENDENT_AMBULATORY_CARE_PROVIDER_SITE_OTHER): Payer: Medicaid Other | Admitting: Obstetrics and Gynecology

## 2021-04-03 VITALS — BP 124/75 | HR 83 | Temp 98.0°F | Wt 163.0 lb

## 2021-04-03 DIAGNOSIS — Z3A36 36 weeks gestation of pregnancy: Secondary | ICD-10-CM | POA: Insufficient documentation

## 2021-04-03 DIAGNOSIS — Z3400 Encounter for supervision of normal first pregnancy, unspecified trimester: Secondary | ICD-10-CM

## 2021-04-03 LAB — OB RESULTS CONSOLE GC/CHLAMYDIA: Gonorrhea: NEGATIVE

## 2021-04-04 LAB — CERVICOVAGINAL ANCILLARY ONLY
Chlamydia: NEGATIVE
Comment: NEGATIVE
Comment: NEGATIVE
Comment: NORMAL
Neisseria Gonorrhea: NEGATIVE
Trichomonas: NEGATIVE

## 2021-04-07 LAB — CULTURE, BETA STREP (GROUP B ONLY): Strep Gp B Culture: NEGATIVE

## 2021-04-08 ENCOUNTER — Encounter: Payer: Self-pay | Admitting: Obstetrics and Gynecology

## 2021-04-08 NOTE — Progress Notes (Signed)
° °  LOW-RISK PREGNANCY OFFICE VISIT Patient name: Cindy Welch MRN [id number]  Date of birth: 1996-03-16 Chief Complaint:   Routine Prenatal Visit  History of Present Illness:   Cindy Welch is a 25 y.o. G1P0 female at [redacted]w[redacted]d with an Estimated Date of Delivery: 04/27/21 being seen today for ongoing management of a low-risk pregnancy.  Today she reports no complaints. Contractions: Not present. Vag. Bleeding: None.  Movement: Present. denies leaking of fluid. Review of Systems:   Pertinent items are noted in HPI Denies abnormal vaginal discharge w/ itching/odor/irritation, headaches, visual changes, shortness of breath, chest pain, abdominal pain, severe nausea/vomiting, or problems with urination or bowel movements unless otherwise stated above. Pertinent History Reviewed:  Reviewed past medical,surgical, social, obstetrical and family history.  Reviewed problem list, medications and allergies. Physical Assessment:   Vitals:   04/03/21 0927  BP: 124/75  Pulse: 83  Temp: 98 F (36.7 C)  Weight: 163 lb (73.9 kg)  Body mass index is 27.55 kg/m.        Physical Examination:   General appearance: Well appearing, and in no distress  Mental status: Alert, oriented to person, place, and time  Skin: Warm & dry  Cardiovascular: Normal heart rate noted  Respiratory: Normal respiratory effort, no distress  Abdomen: Soft, gravid, nontender  Pelvic: Cervical exam deferred         Extremities: Edema: None  Fetal Status: Fetal Heart Rate (bpm): 142 Fundal Height: 32 cm Movement: Present Presentation: Vertex  No results found for this or any previous visit (from the past 24 hour(s)).  Assessment & Plan:  1) Low-risk pregnancy G1P0 at [redacted]w[redacted]d with an Estimated Date of Delivery: 04/27/21   2) Supervision of normal first pregnancy, antepartum  - Culture, beta strep (group b only),  - Cervicovaginal ancillary only( [place])  3) [redacted] weeks gestation of pregnancy    Meds: No orders  of the defined types were placed in this encounter.  Labs/procedures today: GBS and GC/CT  Plan:  Continue routine obstetrical care   Reviewed: Preterm labor symptoms and general obstetric precautions including but not limited to vaginal bleeding, contractions, leaking of fluid and fetal movement were reviewed in detail with the patient.  All questions were answered. Has home bp cuff. Check bp weekly, let us know if >140/90.   Follow-up: Return in about 1 week (around 04/10/2021) for Return OB visit.  Orders Placed This Encounter  Procedures   Culture, beta strep (group b only)   Raelyn Mora MSN, CNM 04/03/2021 9:45 AM

## 2021-04-10 ENCOUNTER — Ambulatory Visit (INDEPENDENT_AMBULATORY_CARE_PROVIDER_SITE_OTHER): Payer: Medicaid Other | Admitting: Obstetrics and Gynecology

## 2021-04-10 ENCOUNTER — Other Ambulatory Visit: Payer: Self-pay

## 2021-04-10 VITALS — BP 125/85 | HR 78 | Temp 98.0°F | Wt 169.2 lb

## 2021-04-10 DIAGNOSIS — Z3A37 37 weeks gestation of pregnancy: Secondary | ICD-10-CM

## 2021-04-10 DIAGNOSIS — Z3400 Encounter for supervision of normal first pregnancy, unspecified trimester: Secondary | ICD-10-CM

## 2021-04-10 DIAGNOSIS — O99011 Anemia complicating pregnancy, first trimester: Secondary | ICD-10-CM

## 2021-04-10 NOTE — Progress Notes (Signed)
° °  LOW-RISK PREGNANCY OFFICE VISIT Patient name: Cindy Welch MRN [id number]  Date of birth: [birth year] Chief Complaint:   Routine Prenatal Visit  History of Present Illness:   Cindy Welch is a 25 y.o. G1P0 female at [redacted]w[redacted]d with an Estimated Date of Delivery: 04/27/21 being seen today for ongoing management of a low-risk pregnancy.  Today she reports occasional contractions. Contractions: Irregular. Vag. Bleeding: None.  Movement: Present. denies leaking of fluid. Review of Systems:   Pertinent items are noted in HPI Denies abnormal vaginal discharge w/ itching/odor/irritation, headaches, visual changes, shortness of breath, chest pain, abdominal pain, severe nausea/vomiting, or problems with urination or bowel movements unless otherwise stated above. Pertinent History Reviewed:  Reviewed past medical,surgical, social, obstetrical and family history.  Reviewed problem list, medications and allergies. Physical Assessment:   Vitals:   04/10/21 0959 04/10/21 1011  BP: (!) 149/93 125/85  Pulse: 71 78  Temp: 98 F (36.7 C)   Weight: 169 lb 3.2 oz (76.7 kg)   Body mass index is 28.59 kg/m.        Physical Examination:   General appearance: Well appearing, and in no distress  Mental status: Alert, oriented to person, place, and time  Skin: Warm & dry  Cardiovascular: Normal heart rate noted  Respiratory: Normal respiratory effort, no distress  Abdomen: Soft, gravid, nontender  Pelvic: Cervical exam deferred         Extremities: Edema: None  Fetal Status: Fetal Heart Rate (bpm): 156 Fundal Height: 36 cm Movement: Present Presentation: Vertex  No results found for this or any previous visit (from the past 24 hour(s)).  Assessment & Plan:  1) Low-risk pregnancy G1P0 at [redacted]w[redacted]d with an Estimated Date of Delivery: 04/27/21   2) Supervision of normal first pregnancy, antepartum - Await labor  3) Anemia affecting pregnancy in first trimester - Taking iron and vitamin c every  other day as prescribed  4) [redacted] weeks gestation of pregnancy    Meds: No orders of the defined types were placed in this encounter.  Labs/procedures today: none  Plan:  Continue routine obstetrical care   Reviewed: Term labor symptoms and general obstetric precautions including but not limited to vaginal bleeding, contractions, leaking of fluid and fetal movement were reviewed in detail with the patient.  All questions were answered. Has home bp cuff. Check bp weekly, let us know if >140/90.   Follow-up: Return in about 1 week (around 04/17/2021) for Return OB visit.  No orders of the defined types were placed in this encounter.  Raelyn Mora MSN, CNM 04/10/2021 10:14 AM

## 2021-04-13 NOTE — L&D Delivery Note (Signed)
OB/GYN Faculty Practice Delivery Note  Cindy Welch is a 26 y.o. G1P0 at [redacted]w[redacted]d admitted for IOL for gHTN.   GBS Status: Negative/-- (12/22 1007) Maximum Maternal Temperature: 98.5  Labor course: Initial SVE: 1 cm. Augmentation with: Pitocin, Cytotec, and IP Foley. She then progressed to complete.  ROM: 4h 36m with meconium stained fluid  Birth: At 1409 a viable female was delivered via spontaneous vaginal delivery (Presentation: ROA). Nuchal cord present: No. Shoulders and body delivered in usual fashion. Body cord draped over infant's LT shoulder x 1 loose loop; infant delivered through. Infant placed directly on mom's abdomen for bonding/skin-to-skin, baby dried and stimulated. Cord clamped x 2 after 1 minute and cut by FOB, Cindy Welch.  Cord blood collected.  The placenta separated spontaneously and delivered via gentle cord traction.  Pitocin infused rapidly IV per protocol.  Fundus firm with massage.  Placenta inspected and appears to be intact with a 3 VC.  Placenta/Cord with the following complications: none.  Cord pH: n/a Sponge and instrument count were correct x2.  Intrapartum complications:  gHTN Anesthesia:  epidural Episiotomy: none Lacerations:  1st degree RT periurethral Suture Repair: vicryl 4.0 EBL (mL): 100   Infant: APGAR (1 MIN): 9   APGAR (5 MINS): 9   Infant weight: pending  Mom to postpartum.  Baby to Couplet care / Skin to Skin. Placenta to L&D   Plans to Breastfeed Contraception: Depo-Provera injections Circumcision: N/A  Note sent to [facility] for pp visit.  Cindy Welch , MSN, CNM 04/18/2021 3:07 PM

## 2021-04-17 ENCOUNTER — Other Ambulatory Visit: Payer: Self-pay

## 2021-04-17 ENCOUNTER — Ambulatory Visit (INDEPENDENT_AMBULATORY_CARE_PROVIDER_SITE_OTHER): Payer: Medicaid Other | Admitting: Obstetrics and Gynecology

## 2021-04-17 ENCOUNTER — Other Ambulatory Visit: Payer: Self-pay | Admitting: Certified Nurse Midwife

## 2021-04-17 ENCOUNTER — Encounter (HOSPITAL_COMMUNITY): Payer: Self-pay | Admitting: Obstetrics & Gynecology

## 2021-04-17 ENCOUNTER — Inpatient Hospital Stay (HOSPITAL_COMMUNITY)
Admission: AD | Admit: 2021-04-17 | Discharge: 2021-04-19 | DRG: 807 | Disposition: A | Discharge status: Home / Self Care | Payer: Medicaid Other | Attending: Family Medicine | Admitting: Family Medicine

## 2021-04-17 VITALS — BP 138/92 | HR 67 | Temp 98.1°F | Wt 166.8 lb

## 2021-04-17 DIAGNOSIS — Z3A38 38 weeks gestation of pregnancy: Secondary | ICD-10-CM

## 2021-04-17 DIAGNOSIS — O133 Gestational [pregnancy-induced] hypertension without significant proteinuria, third trimester: Secondary | ICD-10-CM | POA: Diagnosis present

## 2021-04-17 DIAGNOSIS — O9902 Anemia complicating childbirth: Secondary | ICD-10-CM | POA: Diagnosis present

## 2021-04-17 DIAGNOSIS — Z3400 Encounter for supervision of normal first pregnancy, unspecified trimester: Secondary | ICD-10-CM

## 2021-04-17 DIAGNOSIS — O285 Abnormal chromosomal and genetic finding on antenatal screening of mother: Secondary | ICD-10-CM | POA: Diagnosis present

## 2021-04-17 DIAGNOSIS — O134 Gestational [pregnancy-induced] hypertension without significant proteinuria, complicating childbirth: Secondary | ICD-10-CM | POA: Diagnosis present

## 2021-04-17 DIAGNOSIS — O99011 Anemia complicating pregnancy, first trimester: Secondary | ICD-10-CM

## 2021-04-17 DIAGNOSIS — J45909 Unspecified asthma, uncomplicated: Secondary | ICD-10-CM | POA: Diagnosis present

## 2021-04-17 DIAGNOSIS — Z20822 Contact with and (suspected) exposure to covid-19: Secondary | ICD-10-CM | POA: Diagnosis present

## 2021-04-17 DIAGNOSIS — O9952 Diseases of the respiratory system complicating childbirth: Secondary | ICD-10-CM | POA: Diagnosis present

## 2021-04-17 DIAGNOSIS — Z8759 Personal history of other complications of pregnancy, childbirth and the puerperium: Secondary | ICD-10-CM | POA: Insufficient documentation

## 2021-04-17 DIAGNOSIS — O99013 Anemia complicating pregnancy, third trimester: Secondary | ICD-10-CM | POA: Diagnosis present

## 2021-04-17 LAB — CBC
HCT: 31.1 % — ABNORMAL LOW (ref 36.0–46.0)
Hemoglobin: 10.3 g/dL — ABNORMAL LOW (ref 12.0–15.0)
MCH: 34.4 pg — ABNORMAL HIGH (ref 26.0–34.0)
MCHC: 33.1 g/dL (ref 30.0–36.0)
MCV: 104 fL — ABNORMAL HIGH (ref 80.0–100.0)
Platelets: 268 10*3/uL (ref 150–400)
RBC: 2.99 MIL/uL — ABNORMAL LOW (ref 3.87–5.11)
RDW: 12.3 % (ref 11.5–15.5)
WBC: 7.5 10*3/uL (ref 4.0–10.5)
nRBC: 0 % (ref 0.0–0.2)

## 2021-04-17 LAB — COMPREHENSIVE METABOLIC PANEL
ALT: 10 U/L (ref 0–44)
AST: 23 U/L (ref 15–41)
Albumin: 3.2 g/dL — ABNORMAL LOW (ref 3.5–5.0)
Alkaline Phosphatase: 217 U/L — ABNORMAL HIGH (ref 38–126)
Anion gap: 9 (ref 5–15)
BUN: 5 mg/dL — ABNORMAL LOW (ref 6–20)
CO2: 18 mmol/L — ABNORMAL LOW (ref 22–32)
Calcium: 9 mg/dL (ref 8.9–10.3)
Chloride: 108 mmol/L (ref 98–111)
Creatinine, Ser: 0.53 mg/dL (ref 0.44–1.00)
GFR, Estimated: >60 mL/min (ref >60)
Glucose, Bld: 75 mg/dL (ref 70–99)
Potassium: 4 mmol/L (ref 3.5–5.1)
Sodium: 135 mmol/L (ref 135–145)
Total Bilirubin: 0.8 mg/dL (ref 0.3–1.2)
Total Protein: 6.5 g/dL (ref 6.5–8.1)

## 2021-04-17 LAB — RESP PANEL BY RT-PCR (FLU A&B, COVID) ARPGX2
Influenza A by PCR: NEGATIVE
Influenza B by PCR: NEGATIVE
SARS Coronavirus 2 by RT PCR: NEGATIVE

## 2021-04-17 LAB — TYPE AND SCREEN
ABO/RH(D): B POS
Antibody Screen: NEGATIVE

## 2021-04-17 MED ORDER — LIDOCAINE HCL (PF) 1 % IJ SOLN: 30.0000 mL | INTRAMUSCULAR | Status: DC | PRN

## 2021-04-17 MED ORDER — OXYTOCIN-SODIUM CHLORIDE 30-0.9 UT/500ML-% IV SOLN
2.5000 [IU]/h | INTRAVENOUS | Status: DC
  Administered 2021-04-18: 2.5 [IU]/h via INTRAVENOUS

## 2021-04-17 MED ORDER — OXYCODONE-ACETAMINOPHEN 5-325 MG PO TABS: 1.0000 | ORAL_TABLET | ORAL | Status: DC | PRN

## 2021-04-17 MED ORDER — ONDANSETRON HCL 4 MG/2ML IJ SOLN
4.0000 mg | Freq: Four times a day (QID) | INTRAMUSCULAR | Status: DC | PRN
  Administered 2021-04-17: 4 mg via INTRAVENOUS
  Filled 2021-04-17: qty 2

## 2021-04-17 MED ORDER — SOD CITRATE-CITRIC ACID 500-334 MG/5ML PO SOLN: 30.0000 mL | ORAL | Status: DC | PRN

## 2021-04-17 MED ORDER — FENTANYL CITRATE (PF) 100 MCG/2ML IJ SOLN
INTRAMUSCULAR | Status: AC
  Filled 2021-04-17: qty 2

## 2021-04-17 MED ORDER — LACTATED RINGERS IV SOLN: INTRAVENOUS | Status: DC

## 2021-04-17 MED ORDER — OXYCODONE-ACETAMINOPHEN 5-325 MG PO TABS: 2.0000 | ORAL_TABLET | ORAL | Status: DC | PRN

## 2021-04-17 MED ORDER — MISOPROSTOL 50MCG HALF TABLET
50.0000 ug | ORAL_TABLET | ORAL | Status: DC | PRN
  Administered 2021-04-17 (×2): 50 ug via BUCCAL
  Filled 2021-04-17 (×2): qty 1

## 2021-04-17 MED ORDER — TERBUTALINE SULFATE 1 MG/ML IJ SOLN: 0.2500 mg | Freq: Once | INTRAMUSCULAR | Status: DC | PRN

## 2021-04-17 MED ORDER — FLEET ENEMA 7-19 GM/118ML RE ENEM: 1.0000 | ENEMA | RECTAL | Status: DC | PRN

## 2021-04-17 MED ORDER — OXYTOCIN BOLUS FROM INFUSION
333.0000 mL | Freq: Once | INTRAVENOUS | Status: AC
  Administered 2021-04-18: 333 mL via INTRAVENOUS

## 2021-04-17 MED ORDER — FENTANYL CITRATE (PF) 100 MCG/2ML IJ SOLN
100.0000 ug | Freq: Once | INTRAMUSCULAR | Status: AC
  Administered 2021-04-17: 100 ug via INTRAVENOUS

## 2021-04-17 MED ORDER — ACETAMINOPHEN 325 MG PO TABS: 650.0000 mg | ORAL_TABLET | ORAL | Status: DC | PRN

## 2021-04-17 MED ORDER — LACTATED RINGERS IV SOLN: 500.0000 mL | INTRAVENOUS | Status: DC | PRN

## 2021-04-17 MED ORDER — OXYTOCIN-SODIUM CHLORIDE 30-0.9 UT/500ML-% IV SOLN
1.0000 m[IU]/min | INTRAVENOUS | Status: DC
  Administered 2021-04-17: 2 m[IU]/min via INTRAVENOUS
  Filled 2021-04-17: qty 500

## 2021-04-17 NOTE — Progress Notes (Addendum)
° °  LOW-RISK PREGNANCY OFFICE VISIT Patient name: Cindy Welch MRN [id number]  Date of birth: [birth year] Chief Complaint:   Routine Prenatal Visit  History of Present Illness:   Cindy Welch is a 26 y.o. G1P0 female at [redacted]w[redacted]d with an Estimated Date of Delivery: 04/27/21 being seen today for ongoing management of a low-risk pregnancy.  Today she reports occasional contractions. Contractions: Irregular. Vag. Bleeding: None.  Movement: Present. denies leaking of fluid. Review of Systems:   Pertinent items are noted in HPI Denies abnormal vaginal discharge w/ itching/odor/irritation, headaches, visual changes, shortness of breath, chest pain, abdominal pain, severe nausea/vomiting, or problems with urination or bowel movements unless otherwise stated above. Pertinent History Reviewed:  Reviewed past medical,surgical, social, obstetrical and family history.  Reviewed problem list, medications and allergies. Physical Assessment:   Vitals:   04/17/21 0853 04/17/21 0901  BP: 138/84 (!) 138/92  Pulse: 69 67  Temp: 98.1 F (36.7 C)   Weight: 166 lb 12.8 oz (75.7 kg)   Body mass index is 28.19 kg/m.        Physical Examination:   General appearance: Well appearing, and in no distress  Mental status: Alert, oriented to person, place, and time  Skin: Warm & dry  Cardiovascular: Normal heart rate noted  Respiratory: Normal respiratory effort, no distress  Abdomen: Soft, gravid, nontender  Pelvic: Cervical exam performed  Dilation: Closed Effacement (%): 50 Station: -3  Extremities: Edema: None  Fetal Status: Fetal Heart Rate (bpm): 140 Fundal Height: 36 cm Movement: Present Presentation: Vertex  No results found for this or any previous visit (from the past 24 hour(s)).  Assessment & Plan:  1) Low-risk pregnancy G1P0 at [redacted]w[redacted]d with an Estimated Date of Delivery: 04/27/21   2) Supervision of normal first pregnancy, antepartum  3) Anemia affecting pregnancy in first trimester  4)  Gestational hypertension, third trimester - Elevated BP at 37 wks>>Newly dx'd today - Explained need for admission and IOL  - IOL process explained to patient - Direct admission to L&D - Edd Arbour, CNM notified of admission>>will notify Dr. Debroah Loop and L&D Charge RN  5) [redacted] weeks gestation of pregnancy    Meds: No orders of the defined types were placed in this encounter.  Labs/procedures today: cervical exam  Plan:  Continue routine obstetrical care   Reviewed: Term labor symptoms and general obstetric precautions including but not limited to vaginal bleeding, contractions, leaking of fluid and fetal movement were reviewed in detail with the patient.  All questions were answered. Has home bp cuff. Check bp weekly, let us know if >140/90.   Follow-up: No follow-ups on file.  No orders of the defined types were placed in this encounter.  Raelyn Mora MSN, CNM 04/17/2021 9:25 AM

## 2021-04-17 NOTE — H&P (Signed)
OBSTETRIC ADMISSION HISTORY AND PHYSICAL  Johna RolesMaria T Kurdziel is a 26 y.o. female G1P0 with IUP at 6873w4d by LMP presenting for IOL for gHTN. She reports +FMs, No LOF, no VB, no blurry vision, headaches or peripheral edema, and RUQ pain.  She plans on breast feeding. She requests Depot-provera for birth control. She received her prenatal care at  [facility].    Dating: By LMP --->  Estimated Date of Delivery: 04/27/21  Sono:    @[redacted]w[redacted]d , CWD, normal anatomy, variable presentation, anterior lie, 378 g, 21% EFW   Prenatal History/Complications: Mild hyperemesis gravidarum before 23rd week, unintentional weight loss, anemia, EIF (considered normal variant given low risk AFP) and abnormal SMA testing  Past Medical History: Past Medical History:  Diagnosis Date   Asthma     Past Surgical History: History reviewed. No pertinent surgical history.  Obstetrical History: OB History     Gravida  1   Para      Term      Preterm      AB      Living         SAB      IAB      Ectopic      Multiple      Live Births              Social History Social History   Socioeconomic History   Marital status: Single    Spouse name: Not on file   Number of children: Not on file   Years of education: Not on file   Highest education level: High school graduate  Occupational History    Employer: [employer]  Tobacco Use   Smoking status: Never    Passive exposure: Yes   Smokeless tobacco: Never  Vaping Use   Vaping Use: Never used  Substance and Sexual Activity   Alcohol use: Not Currently   Drug use: Yes    Types: Marijuana    Comment: within the last couple of days   Sexual activity: Yes    Birth control/protection: None  Other Topics Concern   Not on file  Social History Narrative   Not on file   Social Determinants of Health   Financial Resource Strain: Not on file  Food Insecurity: Not on file  Transportation Needs: Not on file  Physical Activity: Not on file   Stress: Not on file  Social Connections: Not on file    Family History: Family History  Problem Relation Age of Onset   Asthma Father    Lupus Maternal Aunt    Cancer Paternal Aunt    Lupus Maternal Grandmother     Allergies: No Known Allergies  Medications Prior to Admission  Medication Sig Dispense Refill Last Dose   albuterol (PROVENTIL HFA;VENTOLIN HFA) 108 (90 Base) MCG/ACT inhaler Inhale 1-2 puffs into the lungs every 6 (six) hours as needed for wheezing or shortness of breath.   Past Week   ascorbic acid (VITAMIN C) 500 MG tablet Take 1 tablet (500 mg total) by mouth every other day. Take with iron pill 30 tablet 8 04/16/2021   Prenatal Vit-Fe Phos-FA-Omega (VITAFOL GUMMIES) 3.33-0.333-34.8 MG CHEW Chew 3 each by mouth daily. 90 tablet 12 04/16/2021   albuterol (PROVENTIL) (2.5 MG/3ML) 0.083% nebulizer solution Take 3 mLs (2.5 mg total) by nebulization every 6 (six) hours as needed for wheezing or shortness of breath. 75 mL 0 More than a month   Blood Pressure Monitoring (BLOOD PRESSURE MONITOR AUTOMAT) DEVI 1 Device  by Does not apply route daily. Automatic blood pressure cuff regular size. To monitor blood pressure regularly at home. ICD-10 code:Z34.90 1 each 0    ferrous sulfate 325 (65 FE) MG EC tablet Take 1 tablet (325 mg total) by mouth every other day. 30 tablet 8    Misc. Devices (GOJJI WEIGHT SCALE) MISC 1 Device by Does not apply route daily as needed. To weight self daily as needed at home. ICD-10 code: Z34.90 1 each 0    ondansetron (ZOFRAN ODT) 8 MG disintegrating tablet Take 1 tablet (8 mg total) by mouth every 8 (eight) hours as needed for nausea or vomiting. 30 tablet 3    promethazine (PHENERGAN) 25 MG tablet Take 1 tablet (25 mg total) by mouth every 6 (six) hours as needed for nausea or vomiting. 30 tablet 1      Review of Systems   All systems reviewed and negative except as stated in HPI  Blood pressure (!) 148/87, pulse (!) 57, temperature 98.3 F (36.8  C), temperature source Oral, height 5' 4.5" (1.638 m), weight 75.7 kg, last menstrual period 07/21/2020. General appearance: alert, cooperative, appears stated age, and no distress Lungs: clear to auscultation bilaterally Heart: regular rate and rhythm Abdomen: soft, non-tender; bowel sounds normal Pelvic: Deferred Extremities: Homans sign is negative, no sign of DVT DTR's normal Presentation:  Vertex Fetal monitoringBaseline: 120 bpm, Variability: Good {> 6 bpm), Accelerations: Reactive, and Decelerations: Absent Uterine activityFrequency: Every 3-5 minutes Dilation: 1 Effacement (%): Thick Station: -3 Exam by:: Lorn Junes, RNC   Prenatal labs: ABO, Rh: --/--/B POS (01/05 1042) Antibody: NEG (01/05 1042) Rubella: 2.27 (06/22 1119) RPR: Non Reactive (10/27 0813)  HBsAg: Negative (06/22 1119)  HIV: Non Reactive (10/27 0813)  GBS: Negative/-- (12/22 1007)  1 hr Glucola within normal limits Genetic screening: Normal AFP, abnormal maternal SMA carrier testing Anatomy US: EIF noted, otherwise normal  Prenatal Transfer Tool  Maternal Diabetes: No Genetic Screening: Abnormal:  Results: Other:increased SMA carrier risk Maternal Ultrasounds/Referrals: Isolated EIF (echogenic intracardiac focus) Fetal Ultrasounds or other Referrals:  None Maternal Substance Abuse:  No Significant Maternal Medications:  Meds include: Other: Ferrous sulfate Significant Maternal Lab Results: Group B Strep negative  Results for orders placed or performed during the hospital encounter of 04/17/21 (from the past 24 hour(s))  CBC   Collection Time: 04/17/21 10:41 AM  Result Value Ref Range   WBC 7.5 4.0 - 10.5 K/uL   RBC 2.99 (L) 3.87 - 5.11 MIL/uL   Hemoglobin 10.3 (L) 12.0 - 15.0 g/dL   HCT 25.8 (L) 52.7 - 78.2 %   MCV 104.0 (H) 80.0 - 100.0 fL   MCH 34.4 (H) 26.0 - 34.0 pg   MCHC 33.1 30.0 - 36.0 g/dL   RDW 42.3 53.6 - 14.4 %   Platelets 268 150 - 400 K/uL   nRBC 0.0 0.0 - 0.2 %  Comprehensive  metabolic panel   Collection Time: 04/17/21 10:41 AM  Result Value Ref Range   Sodium 135 135 - 145 mmol/L   Potassium 4.0 3.5 - 5.1 mmol/L   Chloride 108 98 - 111 mmol/L   CO2 18 (L) 22 - 32 mmol/L   Glucose, Bld 75 70 - 99 mg/dL   BUN 5 (L) 6 - 20 mg/dL   Creatinine, Ser 3.15 0.44 - 1.00 mg/dL   Calcium 9.0 8.9 - 40.0 mg/dL   Total Protein 6.5 6.5 - 8.1 g/dL   Albumin 3.2 (L) 3.5 - 5.0 g/dL  AST 23 15 - 41 U/L   ALT 10 0 - 44 U/L   Alkaline Phosphatase 217 (H) 38 - 126 U/L   Total Bilirubin 0.8 0.3 - 1.2 mg/dL   GFR, Estimated >43 >15 mL/min   Anion gap 9 5 - 15  Type and screen   Collection Time: 04/17/21 10:42 AM  Result Value Ref Range   ABO/RH(D) B POS    Antibody Screen NEG    Sample Expiration      04/20/2021,2359 Performed at [facility], [address], [place], [place] [address]     Patient Active Problem List   Diagnosis Date Noted   Gestational hypertension, third trimester 04/17/2021   Indication for care in labor or delivery 04/17/2021   Abnormal chromosomal and genetic finding on antenatal screening of mother 10/19/2020   Anemia affecting pregnancy in first trimester 10/12/2020   Supervision of normal first pregnancy, antepartum 09/03/2020    Assessment/Plan:  VANIAH CHAMBERS is a 26 y.o. G1P0 at [redacted]w[redacted]d here for IOL due to gHTN.   #Labor: Admit to L&D for induction of anticipated NSVD. Start IVF, labor diet, fetal monitoring. Will start cytotec for cervical ripening. Patient in agreement with plan.  #Gestational hypertension: Recently diagnosed today in the office, will continue to monitor at this time. BP at last check 148/87.  #Anemia in pregnancy: Hgb 10.3 #Pain: Epidural if desired #FWB: Category I #ID:  N/A #MOF: Breast #MOC: Depo #Circ:  N/A  Raylene Everts, MD  04/17/2021, 12:53 PM

## 2021-04-17 NOTE — Progress Notes (Signed)
Cindy Welch is a 26 y.o. G1P0 at [redacted]w[redacted]d by admitted for induction of labor due to asymptomatic gestational hypertension with normal PEC labs.  Subjective: Pt resting comfortably in bed, in a regular contraction pattern but not feeling them well. Tolerated exam and FB placement well. FOB at bedside for support.  Objective: BP 135/74    Pulse 64    Temp 98.5 F (36.9 C) (Oral)    Ht 5' 4.5" (1.638 m)    Wt 166 lb 14.4 oz (75.7 kg)    LMP 07/21/2020 (Exact Date)    BMI 28.21 kg/m  No intake/output data recorded. No intake/output data recorded.  FHT:  FHR: 120 bpm, variability: moderate,  accelerations:  Present,  decelerations:  Absent UC:   irregular, every 2-10 minutes SVE:   Dilation: 1 Effacement (%): Thick Station: -3 Exam by:: Tyler Aas CNM Foley bulb placed easily and inflated with 60cc saline   Labs: Lab Results  Component Value Date   WBC 7.5 04/17/2021   HGB 10.3 (L) 04/17/2021   HCT 31.1 (L) 04/17/2021   MCV 104.0 (H) 04/17/2021   PLT 268 04/17/2021    Assessment / Plan: Labor:  Latent labor, 2nd dose of cytotec given buccally, FB now in place Preeclampsia:  no signs or symptoms of toxicity and labs stable Fetal Wellbeing:  Category I Pain Control:  Labor support without medications I/D:   GBS neg Anticipated MOD:  NSVD  Edd Arbour, CNM, MSN, IBCLC Certified Nurse Midwife, [facility]

## 2021-04-17 NOTE — Progress Notes (Signed)
Initial visit with Byrd Hesselbach at her bedside. She is eagerly awaiting the delivery of her first child, a daughter to be named Copy. She shared that her brother died shortly before she found out she was pregnant so her baby's name is a combination of their names. Chaplain provided grief support.  Pt also wanted advance directive education and chaplain provided documents for review in addition ot education on Living will and Health Care Power of Soldier. Pt's partner is in the room with her, but is not her legal spouse. Shealyn said she believes she will probably complete documentation during her hospitalization. RN informed of limited availability of notary and requested she ask chaplain to contact [facility] if pt desires to complete documents when spiritual care notary is unavailable.  Please page as further needs arise.  Maryanna Shape. Carley Hammed, M.Div. [facility] [facility] Chaplain Pager [phone] Office [phone]

## 2021-04-17 NOTE — Progress Notes (Signed)
Patient ID: Cindy Welch, female   DOB: 03-17-1996, 26 y.o.   MRN: [id number] Doing well  RN States foley bulb came out  Vitals:   04/17/21 1939 04/17/21 2000 04/17/21 2105 04/17/21 2135  BP: 135/74 129/66 (!) 142/90 (!) 142/94  Pulse: 64 63 67 63  Temp: 98.5 F (36.9 C)     TempSrc: Oral     Weight:      Height:       FHR reactive UCs q3-59min  Lasst Cx exam Dilation: 4 Effacement (%): 70 Station: -2 Presentation: Vertex Exam by:: Amado Nash, RN  Will start Pitocin

## 2021-04-18 ENCOUNTER — Encounter (HOSPITAL_COMMUNITY): Payer: Self-pay | Admitting: Obstetrics & Gynecology

## 2021-04-18 ENCOUNTER — Inpatient Hospital Stay (HOSPITAL_COMMUNITY): Payer: Medicaid Other | Admitting: Anesthesiology

## 2021-04-18 DIAGNOSIS — O134 Gestational [pregnancy-induced] hypertension without significant proteinuria, complicating childbirth: Secondary | ICD-10-CM

## 2021-04-18 DIAGNOSIS — Z3A38 38 weeks gestation of pregnancy: Secondary | ICD-10-CM

## 2021-04-18 LAB — CBC
HCT: 30.2 % — ABNORMAL LOW (ref 36.0–46.0)
HCT: 29.5 % — ABNORMAL LOW (ref 36.0–46.0)
Hemoglobin: 10.1 g/dL — ABNORMAL LOW (ref 12.0–15.0)
Hemoglobin: 9.7 g/dL — ABNORMAL LOW (ref 12.0–15.0)
MCH: 34.1 pg — ABNORMAL HIGH (ref 26.0–34.0)
MCH: 33.6 pg (ref 26.0–34.0)
MCHC: 33.4 g/dL (ref 30.0–36.0)
MCHC: 32.9 g/dL (ref 30.0–36.0)
MCV: 102 fL — ABNORMAL HIGH (ref 80.0–100.0)
MCV: 102.1 fL — ABNORMAL HIGH (ref 80.0–100.0)
Platelets: 256 10*3/uL (ref 150–400)
Platelets: 228 10*3/uL (ref 150–400)
RBC: 2.96 MIL/uL — ABNORMAL LOW (ref 3.87–5.11)
RBC: 2.89 MIL/uL — ABNORMAL LOW (ref 3.87–5.11)
RDW: 12.1 % (ref 11.5–15.5)
RDW: 12.1 % (ref 11.5–15.5)
WBC: 10 10*3/uL (ref 4.0–10.5)
WBC: 16.6 10*3/uL — ABNORMAL HIGH (ref 4.0–10.5)
nRBC: 0 % (ref 0.0–0.2)
nRBC: 0 % (ref 0.0–0.2)

## 2021-04-18 LAB — PROTEIN / CREATININE RATIO, URINE
Creatinine, Urine: 109.68 mg/dL
Protein Creatinine Ratio: 0.34 mg/mg{Cre} — ABNORMAL HIGH (ref 0.00–0.15)
Total Protein, Urine: 37 mg/dL

## 2021-04-18 LAB — RPR: RPR Ser Ql: NONREACTIVE

## 2021-04-18 MED ORDER — FENTANYL CITRATE (PF) 100 MCG/2ML IJ SOLN
100.0000 ug | INTRAMUSCULAR | Status: DC | PRN
  Administered 2021-04-18 (×3): 100 ug via INTRAVENOUS
  Filled 2021-04-18 (×3): qty 2

## 2021-04-18 MED ORDER — ZOLPIDEM TARTRATE 5 MG PO TABS: 5.0000 mg | ORAL_TABLET | Freq: Every evening | ORAL | Status: DC | PRN

## 2021-04-18 MED ORDER — LIDOCAINE HCL (PF) 1 % IJ SOLN
INTRAMUSCULAR | Status: DC | PRN
  Administered 2021-04-18: 5 mL via EPIDURAL

## 2021-04-18 MED ORDER — ALBUTEROL SULFATE (2.5 MG/3ML) 0.083% IN NEBU: 2.5000 mg | INHALATION_SOLUTION | Freq: Four times a day (QID) | RESPIRATORY_TRACT | Status: DC | PRN

## 2021-04-18 MED ORDER — LACTATED RINGERS IV SOLN: 500.0000 mL | Freq: Once | INTRAVENOUS | Status: DC

## 2021-04-18 MED ORDER — EPHEDRINE 5 MG/ML INJ: 10.0000 mg | INTRAVENOUS | Status: DC | PRN

## 2021-04-18 MED ORDER — DIPHENHYDRAMINE HCL 25 MG PO CAPS: 25.0000 mg | ORAL_CAPSULE | Freq: Four times a day (QID) | ORAL | Status: DC | PRN

## 2021-04-18 MED ORDER — FENTANYL-BUPIVACAINE-NACL 0.5-0.125-0.9 MG/250ML-% EP SOLN
12.0000 mL/h | EPIDURAL | Status: DC | PRN
  Filled 2021-04-18: qty 250

## 2021-04-18 MED ORDER — WITCH HAZEL-GLYCERIN EX PADS: 1.0000 "application " | MEDICATED_PAD | CUTANEOUS | Status: DC | PRN

## 2021-04-18 MED ORDER — ACETAMINOPHEN 325 MG PO TABS: 650.0000 mg | ORAL_TABLET | ORAL | Status: DC | PRN

## 2021-04-18 MED ORDER — DIPHENHYDRAMINE HCL 50 MG/ML IJ SOLN: 12.5000 mg | INTRAMUSCULAR | Status: DC | PRN

## 2021-04-18 MED ORDER — COCONUT OIL OIL: 1.0000 "application " | TOPICAL_OIL | Status: DC | PRN

## 2021-04-18 MED ORDER — ONDANSETRON HCL 4 MG/2ML IJ SOLN: 4.0000 mg | INTRAMUSCULAR | Status: DC | PRN

## 2021-04-18 MED ORDER — NIFEDIPINE ER OSMOTIC RELEASE 30 MG PO TB24
30.0000 mg | ORAL_TABLET | Freq: Every day | ORAL | Status: DC
  Administered 2021-04-18 – 2021-04-19 (×2): 30 mg via ORAL
  Filled 2021-04-18 (×2): qty 1

## 2021-04-18 MED ORDER — PHENYLEPHRINE 40 MCG/ML (10ML) SYRINGE FOR IV PUSH (FOR BLOOD PRESSURE SUPPORT): 80.0000 ug | PREFILLED_SYRINGE | INTRAVENOUS | Status: DC | PRN

## 2021-04-18 MED ORDER — PHENYLEPHRINE 40 MCG/ML (10ML) SYRINGE FOR IV PUSH (FOR BLOOD PRESSURE SUPPORT)
80.0000 ug | PREFILLED_SYRINGE | INTRAVENOUS | Status: DC | PRN
  Filled 2021-04-18: qty 10

## 2021-04-18 MED ORDER — SIMETHICONE 80 MG PO CHEW: 80.0000 mg | CHEWABLE_TABLET | ORAL | Status: DC | PRN

## 2021-04-18 MED ORDER — DIBUCAINE (PERIANAL) 1 % EX OINT: 1.0000 "application " | TOPICAL_OINTMENT | CUTANEOUS | Status: DC | PRN

## 2021-04-18 MED ORDER — FENTANYL-BUPIVACAINE-NACL 0.5-0.125-0.9 MG/250ML-% EP SOLN
EPIDURAL | Status: DC | PRN
  Administered 2021-04-18: 12 mL/h via EPIDURAL

## 2021-04-18 MED ORDER — MISOPROSTOL 200 MCG PO TABS
ORAL_TABLET | ORAL | Status: AC
  Administered 2021-04-18: 800 ug
  Filled 2021-04-18: qty 5

## 2021-04-18 MED ORDER — FERROUS SULFATE 325 (65 FE) MG PO TABS
325.0000 mg | ORAL_TABLET | ORAL | Status: DC
  Administered 2021-04-19: 325 mg via ORAL
  Filled 2021-04-18: qty 1

## 2021-04-18 MED ORDER — IBUPROFEN 600 MG PO TABS
600.0000 mg | ORAL_TABLET | Freq: Four times a day (QID) | ORAL | Status: DC
  Administered 2021-04-18 – 2021-04-19 (×4): 600 mg via ORAL
  Filled 2021-04-18 (×4): qty 1

## 2021-04-18 MED ORDER — ASCORBIC ACID 500 MG PO TABS
500.0000 mg | ORAL_TABLET | ORAL | Status: DC
  Administered 2021-04-19: 500 mg via ORAL
  Filled 2021-04-18: qty 1

## 2021-04-18 MED ORDER — SENNOSIDES-DOCUSATE SODIUM 8.6-50 MG PO TABS
2.0000 | ORAL_TABLET | Freq: Every day | ORAL | Status: DC
  Administered 2021-04-19: 2 via ORAL
  Filled 2021-04-18: qty 2

## 2021-04-18 MED ORDER — ALBUTEROL SULFATE (2.5 MG/3ML) 0.083% IN NEBU: 3.0000 mL | INHALATION_SOLUTION | Freq: Four times a day (QID) | RESPIRATORY_TRACT | Status: DC | PRN

## 2021-04-18 MED ORDER — ONDANSETRON HCL 4 MG PO TABS: 4.0000 mg | ORAL_TABLET | ORAL | Status: DC | PRN

## 2021-04-18 MED ORDER — PRENATAL MULTIVITAMIN CH
1.0000 | ORAL_TABLET | Freq: Every day | ORAL | Status: DC
  Administered 2021-04-19: 1 via ORAL
  Filled 2021-04-18: qty 1

## 2021-04-18 MED ORDER — BENZOCAINE-MENTHOL 20-0.5 % EX AERO: 1.0000 "application " | INHALATION_SPRAY | CUTANEOUS | Status: DC | PRN

## 2021-04-18 MED ORDER — TETANUS-DIPHTH-ACELL PERTUSSIS 5-2.5-18.5 LF-MCG/0.5 IM SUSY: 0.5000 mL | PREFILLED_SYRINGE | Freq: Once | INTRAMUSCULAR | Status: DC

## 2021-04-18 NOTE — Progress Notes (Signed)
Cindy Welch is a 26 y.o. G1P0 at [redacted]w[redacted]d by LMP admitted for induction of labor due to gHTN.  Subjective: Complete and pushing x 10 minutes. Pain controlled with epidural.  Objective: BP (!) 146/85    Pulse 65    Temp 98 F (36.7 C) (Oral)    Resp 18    Ht 5' 4.5" (1.638 m)    Wt 75.7 kg    LMP 07/21/2020 (Exact Date)    BMI 28.21 kg/m  No intake/output data recorded. No intake/output data recorded.  FHT:  FHR: 130 bpm, variability: minimal ,  accelerations:  Abscent,  decelerations:  Present variable UC:   regular, every 2-3 minutes SVE:   Dilation: 10 Effacement (%): 100 Station: Plus 1 Exam by:: lee  Labs: Lab Results  Component Value Date   WBC 10.0 04/18/2021   HGB 10.1 (L) 04/18/2021   HCT 30.2 (L) 04/18/2021   MCV 102.0 (H) 04/18/2021   PLT 256 04/18/2021    Assessment / Plan: Induction of labor due to gestational hypertension,  progressing well on pitocin  Labor: Progressing on Pitocin Preeclampsia:  labs stable Fetal Wellbeing:  Category I Pain Control:  Epidural I/D:  n/a Anticipated MOD:  NSVD  Raelyn Mora, CNM 04/18/2021, 1:55 PM

## 2021-04-18 NOTE — Lactation Note (Signed)
This note was copied from a baby's chart. Lactation Consultation Note  Patient Name: Cindy Welch [id number] Date: 04/18/2021 Reason for consult: L&D Initial assessment;Early term 37-38.6wks;Primapara;1st time breastfeeding Age:24 hours   L&D Initial Lactation Consult:  Visited with family < 1 hour after birth Baby fussy when I arrived.  Attempted to latch, however, once I placed her near mother's breast she would not open her mouth; became quiet.  Stimulated to initiate a wide gape and attempted a few more times without success.  Gentle chin tug performed to open her mouth and latched well, however, baby still not interested. In sucking. Placed her STS on mother's chest.  Mother shaking and feeling uncomfortable.  Reassured her that lactation services will be available on the M/B unit.  Father and one other visitor present.   Maternal Data    Feeding Mother's Current Feeding Choice: Breast Milk  LATCH Score Latch: Too sleepy or reluctant, no latch achieved, no sucking elicited.  Audible Swallowing: None  Type of Nipple: Everted at rest and after stimulation  Comfort (Breast/Nipple): Soft / non-tender  Hold (Positioning): Assistance needed to correctly position infant at breast and maintain latch.  LATCH Score: 5   Lactation Tools Discussed/Used    Interventions Interventions: Assisted with latch;Skin to skin  Discharge    Consult Status Consult Status: Follow-up from L&D    Cindy Welch [name] 04/18/2021, 2:51 PM

## 2021-04-18 NOTE — Lactation Note (Addendum)
This note was copied from a baby's chart. Lactation Consultation Note  Patient Name: Cindy Welch [id number] Date: 04/18/2021 Reason for consult: Initial assessment;Mother's request Age: 3hrs  LC talked with Mom and mostly formula feeding. Mom to call interested in latching infant at the breast.   Maternal Data    Feeding Mother's Current Feeding Choice: Formula Nipple Type: Slow - flow  LATCH Score Latch: Too sleepy or reluctant, no latch achieved, no sucking elicited.  Audible Swallowing: None  Type of Nipple: Everted at rest and after stimulation  Comfort (Breast/Nipple): Soft / non-tender  Hold (Positioning): Assistance needed to correctly position infant at breast and maintain latch.  LATCH Score: 5   Lactation Tools Discussed/Used    Interventions Interventions: Assisted with latch;Skin to skin  Discharge    Consult Status Consult Status: PRN Date: 04/19/21 Follow-up type: In-patient    Meganne Rita  Nicholson-Springer 04/18/2021, 5:30 PM

## 2021-04-18 NOTE — Anesthesia Procedure Notes (Addendum)
Epidural Patient location during procedure: OB Start time: 04/18/2021 9:58 AM End time: 04/18/2021 10:13 AM  Staffing Anesthesiologist: Trevor Iha, MD Performed: anesthesiologist   Preanesthetic Checklist Completed: patient identified, IV checked, site marked, risks and benefits discussed, surgical consent, monitors and equipment checked, pre-op evaluation and timeout performed  Epidural Patient position: sitting Prep: DuraPrep and site prepped and draped Patient monitoring: continuous pulse ox and blood pressure Approach: midline Location: L3-L4 Injection technique: LOR air  Needle:  Needle type: Tuohy  Needle gauge: 17 G Needle length: 9 cm and 9 Needle insertion depth: 7 cm Catheter type: closed end flexible Catheter size: 19 Gauge Catheter at skin depth: 12 cm Test dose: negative  Assessment Events: blood not aspirated, injection not painful, no injection resistance, no paresthesia and negative IV test  Additional Notes Patient identified. Risks/Benefits/Options discussed with patient including but not limited to bleeding, infection, nerve damage, paralysis, failed block, incomplete pain control, headache, blood pressure changes, nausea, vomiting, reactions to medication both or allergic, itching and postpartum back pain. Confirmed with bedside nurse the patient's most recent platelet count. Confirmed with patient that they are not currently taking any anticoagulation, have any bleeding history or any family history of bleeding disorders. Patient expressed understanding and wished to proceed. All questions were answered. Sterile technique was used throughout the entire procedure. Please see nursing notes for vital signs. Test dose was given through epidural needle and negative prior to continuing to dose epidural or start infusion. Warning signs of high block given to the patient including shortness of breath, tingling/numbness in hands, complete motor block, or any  concerning symptoms with instructions to call for help. Patient was given instructions on fall risk and not to get out of bed. All questions and concerns addressed with instructions to call with any issues.  1 Attempt (S) . Patient tolerated procedure well.

## 2021-04-18 NOTE — Progress Notes (Signed)
Patient ID: Cindy Welch, female   DOB: [birth year], 26 y.o.   MRN: [id number] Sleeping at intervals, not hurting much  Pitocin at 64mu/min  Vitals:   04/18/21 0435 04/18/21 0500 04/18/21 0530 04/18/21 0600  BP: 134/63 138/80 (!) 148/83 (!) 142/88  Pulse: (!) 57 (!) 58 63 (!) 54  Temp:      TempSrc:      Weight:      Height:       FHR stable  UCs regular  Dilation: 4 Effacement (%): 70 Station: -2 Presentation: Vertex Exam by:: Amado Nash, RN Unchanged  Will consider AROM and IUPC next

## 2021-04-18 NOTE — Progress Notes (Signed)
Patient ID: Cindy Welch, female   DOB: [birth year], 26 y.o.   MRN: [id number]  TC to Durwin Reges, RN to notify her of patient receiving Cytotec 800 mcg rectally immediately postpartum for increased vaginal bleeding. Explained that this could be cause of elevated temperature earlier (noted by off-going RN). RN stated decrease in temp to 99.4. Procardia recently given also.  RN advised to call if temp rises again.  Raelyn Mora, CNM

## 2021-04-18 NOTE — Progress Notes (Signed)
Cindy Welch is a 26 y.o. G1P0 at [redacted]w[redacted]d by LMP admitted for induction of labor due to gHTN.  Subjective: [name]. Breathing with contractions after 3 doses of Fentanyl (last dose ~ 15 minutes ago). Planning epidural. Reports SROM with meconium stained fluid. [name].  Objective: BP (!) 134/109    Pulse 63    Temp 98.5 F (36.9 C) (Oral)    Resp 16    Ht 5' 4.5" (1.638 m)    Wt 75.7 kg    LMP 07/21/2020 (Exact Date)    BMI 28.21 kg/m    FHT:  FHR: 125 bpm, variability: moderate,  accelerations:  Present,  decelerations:  Absent UC:   regular, every 2-4 minutes SVE:   Dilation:  (refused) Effacement (%): 70 Station: -2 Exam by:: Amado Nash, RN  Labs: Lab Results  Component Value Date   WBC 10.0 04/18/2021   HGB 10.1 (L) 04/18/2021   HCT 30.2 (L) 04/18/2021   MCV 102.0 (H) 04/18/2021   PLT 256 04/18/2021    Assessment / Plan: Induction of labor due to gestational hypertension,  progressing well on pitocin  Labor: Progressing on Pitocin, will place IUPC after comfortable with epidural Preeclampsia:  labs stable Fetal Wellbeing:  Category I Pain Control:  IV pain meds I/D:  n/a Anticipated MOD:  NSVD  Raelyn Mora, CNM 04/18/2021, 9:34 AM

## 2021-04-18 NOTE — Discharge Summary (Addendum)
Postpartum Discharge Summary     Patient Name: Cindy Welch DOB: [birth year] MRN: [id number]  Date of admission: 04/17/2021 Delivery date:04/18/2021  Delivering provider: Laury Deep  Date of discharge: 04/19/2021  Admitting diagnosis: Indication for care in labor or delivery [O75.9] Intrauterine pregnancy: [redacted]w[redacted]d    Secondary diagnosis:  Principal Problem:   Indication for care in labor or delivery Active Problems:   Supervision of normal first pregnancy, antepartum   Anemia affecting pregnancy in first trimester   Abnormal chromosomal and genetic finding on antenatal screening of mother   Gestational hypertension, third trimester  Additional problems: None    Discharge diagnosis: Term Pregnancy Delivered and Gestational Hypertension                                              Post partum procedures: None Augmentation: Pitocin, Cytotec, and IP Foley Complications: None  Hospital course: Induction of Labor With Vaginal Delivery   26y.o. yo G1P1001 at 356w5das admitted to the hospital 04/17/2021 for induction of labor.  Indication for induction: Gestational hypertension.  Patient had an uncomplicated labor course as follows: Membrane Rupture Time/Date: 9:14 AM ,04/18/2021   Delivery Method:Vaginal, Spontaneous  Episiotomy: None  Lacerations:  1st degree;Periurethral  Details of delivery can be found in separate delivery note.  Patient had a routine postpartum course. She had elevated blood pressures that met criteria for gHTN and post partum had >3 BP above 130s or above 80s and therefore was started on and discharged home on Procardia 3048matient is discharged home 04/19/21.  Newborn Data: Birth date:04/18/2021  Birth time:2:09 PM  Gender:Female  Living status:Living  Apgars:9 ,9  Weight:2466 g   Magnesium Sulfate received: No BMZ received: No Rhophylac:N/A MMR:N/A T-DaP:Given prenatally on 02/06/2021 Flu: No - declined Transfusion:No  Physical exam  Vitals:    04/18/21 1935 04/18/21 2100 04/19/21 0140 04/19/21 0515  BP: 138/77 134/72 131/82 134/82  Pulse: 77 63 60 64  Resp: 20 20 20 18   Temp: 99.1 F (37.3 C) 98.1 F (36.7 C) 98.5 F (36.9 C) 97.8 F (36.6 C)  TempSrc: Oral Oral Oral Oral  SpO2:   100% 100%  Weight:      Height:       General: alert Lochia: appropriate Uterine Fundus: firm Incision: N/A DVT Evaluation: No evidence of DVT seen on physical exam. Labs: Lab Results  Component Value Date   WBC 16.6 (H) 04/18/2021   HGB 9.7 (L) 04/18/2021   HCT 29.5 (L) 04/18/2021   MCV 102.1 (H) 04/18/2021   PLT 228 04/18/2021   CMP Latest Ref Rng & Units 04/17/2021  Glucose 70 - 99 mg/dL 75  BUN 6 - 20 mg/dL 5(L)  Creatinine 0.44 - 1.00 mg/dL 0.53  Sodium 135 - 145 mmol/L 135  Potassium 3.5 - 5.1 mmol/L 4.0  Chloride 98 - 111 mmol/L 108  CO2 22 - 32 mmol/L 18(L)  Calcium 8.9 - 10.3 mg/dL 9.0  Total Protein 6.5 - 8.1 g/dL 6.5  Total Bilirubin 0.3 - 1.2 mg/dL 0.8  Alkaline Phos 38 - 126 U/L 217(H)  AST 15 - 41 U/L 23  ALT 0 - 44 U/L 10   Edinburgh Score: Edinburgh Postnatal Depression Scale Screening Tool 04/18/2021  I have been able to laugh and see the funny side of things. 0  I have looked forward with enjoyment  to things. 0  I have blamed myself unnecessarily when things went wrong. 2  I have been anxious or worried for no good reason. 2  I have felt scared or panicky for no good reason. 1  Things have been getting on top of me. 2  I have been so unhappy that I have had difficulty sleeping. 0  I have felt sad or miserable. 1  I have been so unhappy that I have been crying. 1  The thought of harming myself has occurred to me. 0  Edinburgh Postnatal Depression Scale Total 9     After visit meds:  Allergies as of 04/19/2021   No Known Allergies      Medication List     STOP taking these medications    ascorbic acid 500 MG tablet Commonly known as: VITAMIN C   ferrous sulfate 325 (65 FE) MG tablet   [place]  Weight Scale Misc       TAKE these medications    acetaminophen 325 MG tablet Commonly known as: Tylenol Take 2 tablets (650 mg total) by mouth every 4 (four) hours as needed (for pain scale < 4).   albuterol 108 (90 Base) MCG/ACT inhaler Commonly known as: VENTOLIN HFA Inhale 1-2 puffs into the lungs every 6 (six) hours as needed for wheezing or shortness of breath. What changed: Another medication with the same name was removed. Continue taking this medication, and follow the directions you see here.   Blood Pressure Monitor Automat Devi 1 Device by Does not apply route daily. Automatic blood pressure cuff regular size. To monitor blood pressure regularly at home. ICD-10 code:Z34.90   ibuprofen 600 MG tablet Commonly known as: ADVIL Take 1 tablet (600 mg total) by mouth every 6 (six) hours.   NIFEdipine 30 MG 24 hr tablet Commonly known as: ADALAT CC Take 1 tablet (30 mg total) by mouth daily.   Vitafol Gummies 3.33-0.333-34.8 MG Chew Chew 3 each by mouth daily.         Discharge home in stable condition Infant Feeding: Breast Infant Disposition:home with mother Discharge instruction: per After Visit Summary and Postpartum booklet. Activity: Advance as tolerated. Pelvic rest for 6 weeks.  Diet: routine diet Future Appointments: Future Appointments  Date Time Provider [place]  05/29/2021  9:35 AM Laury Deep, CNM [facility] None   Follow up Visit:  Follow-up Information     [place]. Schedule an appointment as soon as possible for a visit in 1 week(s).   Specialty: Obstetrics and Gynecology Why: blood pressure recheck Contact information: [place] [address] [place]. Schedule an appointment as soon as possible for a visit in 6 week(s).   Specialty: Obstetrics and Gynecology Why: postpartum visit Contact information: [place] [address] [phone]                Message sent to [facility] by R. Renato Battles, CNM on 04/18/2021 Please schedule this patient for a In person postpartum visit in 6 weeks with the following provider: Any provider. Additional Postpartum F/U:BP check 1 week  Low risk pregnancy complicated by: HTN Delivery mode:  Vaginal, Spontaneous  Anticipated Birth Control:  Depo  Renard Matter, MD, MPH OB Fellow, Faculty Practice

## 2021-04-18 NOTE — Anesthesia Preprocedure Evaluation (Signed)
Anesthesia Evaluation  Patient identified by MRN, date of birth, ID band Patient awake    Reviewed: Allergy & Precautions, NPO status , Patient's Chart, lab work & pertinent test results  Airway Mallampati: II  TM Distance: >3 FB Neck ROM: Full    Dental no notable dental hx. (+) Teeth Intact, Dental Advisory Given   Pulmonary asthma ,    Pulmonary exam normal breath sounds clear to auscultation       Cardiovascular hypertension (gHtn), Normal cardiovascular exam Rhythm:Regular Rate:Normal     Neuro/Psych negative neurological ROS  negative psych ROS   GI/Hepatic negative GI ROS, Neg liver ROS,   Endo/Other  negative endocrine ROS  Renal/GU negative Renal ROS     Musculoskeletal   Abdominal   Peds  Hematology  (+) anemia , Lab Results      Component                Value               Date                      WBC                      10.0                04/18/2021                HGB                      10.1 (L)            04/18/2021                HCT                      30.2 (L)            04/18/2021                MCV                      102.0 (H)           04/18/2021                PLT                      256                 04/18/2021              Anesthesia Other Findings   Reproductive/Obstetrics (+) Pregnancy                             Anesthesia Physical Anesthesia Plan  ASA: 3  Anesthesia Plan: Epidural   Post-op Pain Management:    Induction:   PONV Risk Score and Plan:   Airway Management Planned:   Additional Equipment:   Intra-op Plan:   Post-operative Plan:   Informed Consent: I have reviewed the patients History and Physical, chart, labs and discussed the procedure including the risks, benefits and alternatives for the proposed anesthesia with the patient or authorized representative who has indicated his/her understanding and acceptance.        Plan Discussed with:   Anesthesia Plan Comments: (38.5 wk primagravida w gHtn for LEA)  Anesthesia Quick Evaluation

## 2021-04-19 MED ORDER — IBUPROFEN 600 MG PO TABS: 600.0000 mg | ORAL_TABLET | Freq: Four times a day (QID) | ORAL | 0 refills | Status: DC

## 2021-04-19 MED ORDER — NIFEDIPINE ER 30 MG PO TB24: 30.0000 mg | ORAL_TABLET | Freq: Every day | ORAL | 0 refills | Status: DC

## 2021-04-19 MED ORDER — ACETAMINOPHEN 325 MG PO TABS: 650.0000 mg | ORAL_TABLET | ORAL | 0 refills | Status: DC | PRN

## 2021-04-19 MED ORDER — MEDROXYPROGESTERONE ACETATE 150 MG/ML IM SUSP
150.0000 mg | Freq: Once | INTRAMUSCULAR | Status: AC
  Administered 2021-04-19: 150 mg via INTRAMUSCULAR
  Filled 2021-04-19: qty 1

## 2021-04-19 NOTE — Anesthesia Postprocedure Evaluation (Signed)
Anesthesia Post Note  Patient: Cindy Welch  Procedure(s) Performed: AN AD HOC LABOR EPIDURAL     Patient location during evaluation: Mother Baby Anesthesia Type: Epidural Level of consciousness: awake and alert Pain management: pain level controlled Vital Signs Assessment: post-procedure vital signs reviewed and stable Respiratory status: spontaneous breathing, nonlabored ventilation and respiratory function stable Cardiovascular status: stable Postop Assessment: no headache, no backache and epidural receding Anesthetic complications: no   No notable events documented.  Last Vitals:  Vitals:   04/19/21 0140 04/19/21 0515  BP: 131/82 134/82  Pulse: 60 64  Resp: 20 18  Temp: 36.9 C 36.6 C  SpO2: 100% 100%    Last Pain:  Vitals:   04/19/21 0515  TempSrc: Oral  PainSc: 0-No pain   Pain Goal: Patients Stated Pain Goal: 0 (04/17/21 1615)                 Stefani Dama

## 2021-04-19 NOTE — Clinical Social Work Maternal (Signed)
CLINICAL SOCIAL WORK MATERNAL/CHILD NOTE  Patient Details  Name: Cindy Welch MRN: [id number] Date of Birth: [birth year]  Date:  04/19/2021  Clinical Social Worker Initiating Note:  Darcus Austin, MSW, LCSWA Date/Time: Initiated:  04/19/21/1013     Child's Name:  Cindy Welch   Biological Parents:  Mother, Father   Need for Interpreter:  None   Reason for Referral:  Current Substance Use/Substance Use During Pregnancy     Address:  [place] [address]    Phone number:  [phone] (home)     Additional phone number: [phone] Russ Halo (FOB)  Household Members/Support Persons (HM/SP):   Household Member/Support Person 1 (MOB's father, and two siblings)   HM/SP Name Relationship DOB or Age  HM/SP -1 Shymek Bell FOB    HM/SP -2        HM/SP -3        HM/SP -4        HM/SP -5        HM/SP -6        HM/SP -7        HM/SP -8          Natural Supports (not living in the home):  Parent, Immediate Family   Professional Supports: None   Employment: Unemployed   Type of Work:     Education:  Programmer, systems   Homebound arranged:    Museum/gallery curator Resources:  Medicaid   Other Resources:  [facility]   Cultural/Religious Considerations Which May Impact Care:    Strengths:  Ability to meet basic needs  , Engineer, materials, Home prepared for child     Psychotropic Medications:         Pediatrician:    [place]olic[place] area  Pediatrician List:   [facility] ([address])  [place]      Pediatrician Fax Number:    Risk Factors/Current Problems:  Substance Use     Cognitive State:  Able to Concentrate  , Alert  , Goal Oriented  , Insightful  , Linear Thinking     Mood/Affect:  Comfortable  , Interested  , Calm  , Happy  , Relaxed     CSW Assessment: CSW met with MOB to complete consult for [place], and  substance use during pregnancy. CSW observed MOB resting in bed, and FOB sitting on couch bonding with infant. MOB gave CSW verbal consent to complete consult while FOB was present. CSW explained role, and reason for consult. MOB was pleasant, and polite during engagement with CSW. MOB denied any history of mental health, psychotropic medication, and therapy involvement. 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 MOB reported, her duration of THC use has been for a few years. MOB reported, her reason for [facility] use during this pregnancy was for appetite reasons. MOB denied any additional illicit substances, and CPS involvement. CSW inform MOB of drug screen policy, and MOB was understanding of protocol. CSW will continue to follow the CDS, and will make CPS report if warranted.   CSW provided education regarding the baby blues period vs. perinatal mood disorders, discussed treatment and gave resources for mental health follow up if concerns arise. CSW recommends self- evaluation during the postpartum time period using the New Mom Checklist from Postpartum Progress and encouraged MOB to contact a medical professional  if symptoms are noted at any time.   MOB reported, since delivery she feels, "good". MOB reported, her parents, and siblings are very supportive. MOB denied SI, HI when CSW assessed for safety.   MOB reported, she receives [facility], but does not receives food stamps. MOB reported, infant's pediatrician will be at [facility]. MOB reported, there are no transportation barriers to follow up infant's care. MOB reported, she has all essentials needed to care for infant. MOB reported, infant has a car seat, and bassinet. MOB denied any additional barriers.     CSW provided education on sudden infant death syndrome (SIDS).  CSW will continue to follow the CDS, and will make CPS report if warranted.  CSW Plan/Description:  No Further Intervention Required/No  Barriers to Discharge, Sudden Infant Death Syndrome (SIDS) Education, Perinatal Mood and Anxiety Disorder (PMADs) Education, CSW Will Continue to Monitor Umbilical Cord Tissue Drug Screen Results and Make Report if [facility], [place], MSW, LCSW-A Clinical Social Worker- Weekends [phone]    Darcus Austin, Latanya Presser 04/19/2021, 10:17 AM

## 2021-04-19 NOTE — Lactation Note (Signed)
This note was copied from a baby's chart. Lactation Consultation Note  Patient Name: Cindy Welch [id number] Date: 04/19/2021 Reason for consult: Initial assessment;Mother's request;Difficult latch;Primapara;1st time breastfeeding;Early term 37-38.6wks;Infant < 6lbs;Breastfeeding assistance;MD order Age:24 hours  LC went in to work with mom on breastfeeding. Infant not able to get enough depth at breast even in a cross cradle prone position. LC reviewed with Mom following up with [facility] outpatient services to work on latch after discharge.   Infant spoon feed colostrum and can extend tongue pass the gum line.   Infant needs to increase volume and is struggling with both yellow slow flow and white extra slow flow. RN working with infant with SLP Nfant nipple and will put in a speech consult in am.  Plan 1. To feed based on cues 8-12x 24hr period. LC not able to achieve a deep enough latch and with shallow latch concern lead to nipple trauma.  2. Mom aware to increase volume per feeding 20 ml or more. 3. DEBP q 3hrs for 15 min  4. I and O sheet reviewed.  All questions answered at the end of the visit.   Maternal Data Has patient been taught Hand Expression?: Yes Does the patient have breastfeeding experience prior to this delivery?: No  Feeding Mother's Current Feeding Choice: Breast Milk and Formula  LATCH Score                    Lactation Tools Discussed/Used Tools: Pump;Flanges Flange Size: 24;27 Breast pump type: Double-Electric Breast Pump (mom currently using 24 flange, but with pumping nipples can swell, she will adjust accordingly and ask for assistance if experience any discomfort.) Pump Education: Setup, frequency, and cleaning;Milk Storage Reason for Pumping: increase stimulation Pumping frequency: every 3 hrs for 15 min  Interventions Interventions: Assisted with latch;Skin to skin;Breast massage;Hand express;Breast compression;Adjust position;Support  pillows;Position options;Expressed milk;DEBP;Education;Pace feeding;LC Psychologist, educational;Infant Driven Feeding Algorithm education  Discharge Pump: Manual WIC Program: Yes  Consult Status Consult Status: Follow-up Date: 04/20/21 Follow-up type: In-patient    Cindy Welch  Nicholson-Springer 04/19/2021, 6:20 PM

## 2021-04-24 ENCOUNTER — Telehealth: Payer: Self-pay | Admitting: Student

## 2021-04-24 ENCOUNTER — Other Ambulatory Visit: Payer: Self-pay

## 2021-04-24 ENCOUNTER — Ambulatory Visit (INDEPENDENT_AMBULATORY_CARE_PROVIDER_SITE_OTHER): Payer: Medicaid Other | Admitting: *Deleted

## 2021-04-24 ENCOUNTER — Encounter: Payer: Medicaid Other | Admitting: Obstetrics and Gynecology

## 2021-04-24 VITALS — BP 161/109 | HR 58 | Temp 98.6°F | Ht 65.0 in | Wt 150.2 lb

## 2021-04-24 DIAGNOSIS — O165 Unspecified maternal hypertension, complicating the puerperium: Secondary | ICD-10-CM

## 2021-04-24 NOTE — Progress Notes (Signed)
Subjective:  Cindy Welch is a 26 y.o. female here for BP check.   Hypertension ROS: taking medications as instructed, no medication side effects noted, no TIA's, no chest pain on exertion, no dyspnea on exertion, no swelling of ankles, no orthostatic dizziness or lightheadedness, no orthopnea or paroxysmal nocturnal dyspnea, and no palpitations.    Objective:  BP (!) 161/109 (BP Location: Left Arm, Patient Position: Sitting, Cuff Size: Normal)    Pulse (!) 58    Temp 98.6 F (37 C) (Oral)    Ht 5\' 5"  (1.651 m)    Wt 150 lb 3.2 oz (68.1 kg)    LMP 07/21/2020 (Exact Date)    Breastfeeding Yes    BMI 24.99 kg/m   Appearance alert, well appearing, and in no distress, oriented to person, place, and time, and normal appearing weight. General exam BP noted to be well controlled today in office.   BP 161/98, HR: 60   Assessment:   Blood Pressure asymptomatic, no significant medication side effects noted, poorly controlled, needs further observation, and needs improvement.   Plan:  Patient sent to [facility] for further evaluation per 09/20/2020, CNM .   Raelyn Mora, RN

## 2021-04-24 NOTE — Telephone Encounter (Signed)
TC to patient as patient was supposed to come to [facility] for pre-e workup per TC with Raelyn Mora. Called patient and she was "not at home" but she did take her BP medicine and reports that she took her BP earlier and it was "fine"--patient given warning signs, encouraged to take her BP meds daily, return to [facility] if she has floating spots, blurry vision, HA, sudden swelling. Reviewed warning BPs; will send a message to [facility] to have patient come in for BP check next week.   Luna Kitchens

## 2021-05-01 ENCOUNTER — Telehealth (HOSPITAL_COMMUNITY): Payer: Self-pay | Admitting: *Deleted

## 2021-05-01 ENCOUNTER — Encounter: Payer: Medicaid Other | Admitting: Obstetrics and Gynecology

## 2021-05-01 NOTE — Telephone Encounter (Signed)
Hospital Discharge Follow-Up Call:  Patient reports that she is well and has no concerns about her healing process.  EPDS today was 3 and she endorses this accurately reflects that she is doing well emotionally.  Patient says that baby is well and she has no concerns about baby's health.  She reports that baby sleeps in a bassinet.  ABCs of Safe Sleep reviewed.

## 2021-05-29 ENCOUNTER — Ambulatory Visit: Payer: Medicaid Other | Admitting: Obstetrics and Gynecology

## 2021-05-29 ENCOUNTER — Telehealth: Payer: Self-pay | Admitting: *Deleted

## 2021-05-29 NOTE — Telephone Encounter (Signed)
Left voice message for patient to call clinic with blood pressure reading. Missed PP appt this AM. Will send Mychart message.  Clovis Pu, RN

## 2022-01-06 ENCOUNTER — Ambulatory Visit
Admission: EM | Admit: 2022-01-06 | Discharge: 2022-01-06 | Disposition: A | Discharge status: Home / Self Care | Payer: Medicaid Other | Attending: Physician Assistant | Admitting: Physician Assistant

## 2022-01-06 ENCOUNTER — Encounter: Payer: Self-pay | Admitting: Physician Assistant

## 2022-01-06 DIAGNOSIS — J45901 Unspecified asthma with (acute) exacerbation: Secondary | ICD-10-CM | POA: Diagnosis present

## 2022-01-06 DIAGNOSIS — Z1152 Encounter for screening for COVID-19: Secondary | ICD-10-CM

## 2022-01-06 LAB — RESP PANEL BY RT-PCR (FLU A&B, COVID) ARPGX2
Influenza A by PCR: NEGATIVE
Influenza B by PCR: NEGATIVE
SARS Coronavirus 2 by RT PCR: NEGATIVE

## 2022-01-06 MED ORDER — PREDNISONE 20 MG PO TABS: 40.0000 mg | ORAL_TABLET | Freq: Every day | ORAL | 0 refills | Status: AC

## 2022-01-06 MED ORDER — AZITHROMYCIN 250 MG PO TABS: 250.0000 mg | ORAL_TABLET | Freq: Every day | ORAL | 0 refills | Status: DC

## 2022-01-06 NOTE — ED Triage Notes (Addendum)
Pt presents to [facility] with co of asthma symptom flair up , chest tightness, and sob she took 3 albuterol treatments yesterday and one this morning. Pt reports she is normally able to function without a treatment. Pt reports congestion and hot flashes as well for 3 days.

## 2022-01-06 NOTE — ED Provider Notes (Signed)
[name]    CSN: [id number] Arrival date & time: 01/06/22  0809      History   Chief Complaint Chief Complaint  Patient presents with   Shortness of Breath    HPI Cindy Welch is a 26 y.o. female.   Patient here today for evaluation of asthma flare.  She reports that she has had shortness of breath, chest tightness and cough for the last 3 days.  She has been using her albuterol without significant improvement.  She reports normally she is not needing albuterol.  She has not had fever.  She has had some sore throat and has been using cough drops which has been helpful.  She denies any nausea, vomiting or diarrhea.  The history is provided by the patient.  Shortness of Breath Associated symptoms: cough, sore throat and wheezing   Associated symptoms: no ear pain, no fever and no vomiting     Past Medical History:  Diagnosis Date   Asthma     Patient Active Problem List   Diagnosis Date Noted   Gestational hypertension, third trimester 04/17/2021   Indication for care in labor or delivery 04/17/2021   Abnormal chromosomal and genetic finding on antenatal screening of mother 10/19/2020   Anemia affecting pregnancy in first trimester 10/12/2020   Supervision of normal first pregnancy, antepartum 09/03/2020    History reviewed. No pertinent surgical history.  OB History     Gravida  1   Para  1   Term  1   Preterm      AB      Living  1      SAB      IAB      Ectopic      Multiple  0   Live Births  1            Home Medications    Prior to Admission medications   Medication Sig Start Date End Date Taking? Authorizing Provider  azithromycin (ZITHROMAX) 250 MG tablet Take 1 tablet (250 mg total) by mouth daily. Take first 2 tablets together, then 1 every day until finished. 01/06/22  Yes Francene Finders, PA-C  predniSONE (DELTASONE) 20 MG tablet Take 2 tablets (40 mg total) by mouth daily with breakfast for 5 days. 01/06/22  01/11/22 Yes Francene Finders, PA-C  acetaminophen (TYLENOL) 325 MG tablet Take 2 tablets (650 mg total) by mouth every 4 (four) hours as needed (for pain scale < 4). Patient not taking: Reported on 04/24/2021 04/19/21   Renard Matter, MD  albuterol (PROVENTIL HFA;VENTOLIN HFA) 108 (90 Base) MCG/ACT inhaler Inhale 1-2 puffs into the lungs every 6 (six) hours as needed for wheezing or shortness of breath.    [provider]  Blood Pressure Monitoring (BLOOD PRESSURE MONITOR AUTOMAT) DEVI 1 Device by Does not apply route daily. Automatic blood pressure cuff regular size. To monitor blood pressure regularly at home. ICD-10 code:Z34.90 09/03/20   Laury Deep, CNM  ibuprofen (ADVIL) 600 MG tablet Take 1 tablet (600 mg total) by mouth every 6 (six) hours. Patient not taking: Reported on 04/24/2021 04/19/21   Renard Matter, MD  NIFEdipine (ADALAT CC) 30 MG 24 hr tablet Take 1 tablet (30 mg total) by mouth daily. 04/19/21   Renard Matter, MD  Prenatal Vit-Fe Phos-FA-Omega (VITAFOL GUMMIES) 3.33-0.333-34.8 MG CHEW Chew 3 each by mouth daily. 09/03/20   Laury Deep, CNM    Family History Family History  Problem Relation Age of Onset  Asthma Father    Lupus Maternal Aunt    Cancer Paternal Aunt    Lupus Maternal Grandmother     Social History Social History   Tobacco Use   Smoking status: Never    Passive exposure: Yes   Smokeless tobacco: Never  Vaping Use   Vaping Use: Never used  Substance Use Topics   Alcohol use: Not Currently   Drug use: Yes    Types: Marijuana    Comment: within the last couple of days     Allergies   Patient has no known allergies.   Review of Systems Review of Systems  Constitutional:  Negative for chills and fever.  HENT:  Positive for congestion and sore throat. Negative for ear pain.   Eyes:  Negative for discharge and redness.  Respiratory:  Positive for cough, shortness of breath and wheezing.   Gastrointestinal:  Negative for diarrhea, nausea and  vomiting.     Physical Exam Triage Vital Signs ED Triage Vitals  Enc Vitals Group     BP 01/06/22 0824 128/83     Pulse Rate 01/06/22 0824 90     Resp 01/06/22 0824 18     Temp 01/06/22 0824 97.9 F (36.6 C)     Temp src --      SpO2 01/06/22 0824 95 %     Weight --      Height --      Head Circumference --      Peak Flow --      Pain Score 01/06/22 0823 0     Pain Loc --      Pain Edu? --      Excl. in [place]? --    No data found.  Updated Vital Signs BP 128/83   Pulse 90   Temp 97.9 F (36.6 C)   Resp 18   LMP 12/21/2021   SpO2 95%      Physical Exam Vitals and nursing note reviewed.  Constitutional:      General: She is not in acute distress.    Appearance: Normal appearance. She is not ill-appearing.  HENT:     Head: Normocephalic and atraumatic.     Nose: Congestion present.     Mouth/Throat:     Mouth: Mucous membranes are moist.     Pharynx: No oropharyngeal exudate or posterior oropharyngeal erythema.  Eyes:     Conjunctiva/sclera: Conjunctivae normal.  Cardiovascular:     Rate and Rhythm: Normal rate and regular rhythm.     Heart sounds: Normal heart sounds. No murmur heard. Pulmonary:     Effort: Pulmonary effort is normal. No respiratory distress.     Breath sounds: Wheezing (scattered, rare) present. No rhonchi or rales.  Skin:    General: Skin is warm and dry.  Neurological:     Mental Status: She is alert.  Psychiatric:        Mood and Affect: Mood normal.        Thought Content: Thought content normal.      UC Treatments / Results  Labs (all labs ordered are listed, but only abnormal results are displayed) Labs Reviewed  RESP PANEL BY RT-PCR (FLU A&B, COVID) [id number]    EKG   Radiology No results found.  Procedures Procedures (including critical care time)  Medications Ordered in UC Medications - No data to display  Initial Impression / Assessment and Plan / UC Course  I have reviewed the triage vital signs and the nursing  notes.  Pertinent labs & imaging results that were available during my care of the patient were reviewed by me and considered in my medical decision making (see chart for details).    We will screen for COVID and flu and will treat to cover asthma exacerbation with steroid burst and Z-Pak.  Recommended follow-up if no gradual improvement of symptoms or with any further concerns.  Final Clinical Impressions(s) / UC Diagnoses   Final diagnoses:  Encounter for screening for COVID-19  Asthma with acute exacerbation, unspecified asthma severity, unspecified whether persistent   Discharge Instructions   None    ED Prescriptions     Medication Sig Dispense Auth. Provider   predniSONE (DELTASONE) 20 MG tablet Take 2 tablets (40 mg total) by mouth daily with breakfast for 5 days. 10 tablet Erma Pinto F, PA-C   azithromycin (ZITHROMAX) 250 MG tablet Take 1 tablet (250 mg total) by mouth daily. Take first 2 tablets together, then 1 every day until finished. 6 tablet Tomi Bamberger, PA-C      PDMP not reviewed this encounter.   Tomi Bamberger, PA-C 01/06/22 [id number]

## 2022-07-10 ENCOUNTER — Ambulatory Visit
Admission: EM | Admit: 2022-07-10 | Discharge: 2022-07-10 | Disposition: A | Discharge status: Home / Self Care | Payer: Medicaid Other | Attending: Family Medicine | Admitting: Family Medicine

## 2022-07-10 DIAGNOSIS — A084 Viral intestinal infection, unspecified: Secondary | ICD-10-CM | POA: Diagnosis not present

## 2022-07-10 LAB — POCT URINE PREGNANCY: Preg Test, Ur: NEGATIVE

## 2022-07-10 MED ORDER — PROMETHAZINE HCL 25 MG PO TABS: 25.0000 mg | ORAL_TABLET | Freq: Four times a day (QID) | ORAL | 0 refills | Status: DC | PRN

## 2022-07-10 MED ORDER — ONDANSETRON HCL 4 MG/2ML IJ SOLN
4.0000 mg | Freq: Once | INTRAMUSCULAR | Status: AC
  Administered 2022-07-10: 4 mg via INTRAMUSCULAR

## 2022-07-10 NOTE — ED Provider Notes (Signed)
[name]    CSN: [id number] Arrival date & time: 07/10/22  1724      History   Chief Complaint Chief Complaint  Patient presents with   Nausea   Emesis    HPI Cindy Welch is a 27 y.o. female.    Emesis  Here for nausea and vomiting and diarrhea.  She began being nauseated yesterday afternoon, and then began throwing up this morning.  She has had numerous episodes of emesis and of diarrhea today.  She took a Zofran ODT, and continued to vomit.  No fever noted with this.  No cough or congestion.  She cannot recall when her last period was  Past Medical History:  Diagnosis Date   Asthma     Patient Active Problem List   Diagnosis Date Noted   Gestational hypertension, third trimester 04/17/2021   Indication for care in labor or delivery 04/17/2021   Abnormal chromosomal and genetic finding on antenatal screening of mother 10/19/2020   Anemia affecting pregnancy in first trimester 10/12/2020   Supervision of normal first pregnancy, antepartum 09/03/2020    History reviewed. No pertinent surgical history.  OB History     Gravida  1   Para  1   Term  1   Preterm      AB      Living  1      SAB      IAB      Ectopic      Multiple  0   Live Births  1            Home Medications    Prior to Admission medications   Medication Sig Start Date End Date Taking? Authorizing Provider  albuterol (PROVENTIL HFA;VENTOLIN HFA) 108 (90 Base) MCG/ACT inhaler Inhale 1-2 puffs into the lungs every 6 (six) hours as needed for wheezing or shortness of breath.   Yes [provider]  promethazine (PHENERGAN) 25 MG tablet Take 1 tablet (25 mg total) by mouth every 6 (six) hours as needed for nausea or vomiting. 07/10/22  Yes Aishia Barkey, Gwenlyn Perking, MD    Family History Family History  Problem Relation Age of Onset   Asthma Father    Lupus Maternal Aunt    Cancer Paternal Aunt    Lupus Maternal Grandmother     Social  History Social History   Tobacco Use   Smoking status: Never    Passive exposure: Yes   Smokeless tobacco: Never  Vaping Use   Vaping Use: Never used  Substance Use Topics   Alcohol use: Yes    Comment: social   Drug use: Not Currently    Types: Marijuana    Comment: within the last couple of days     Allergies   Patient has no known allergies.   Review of Systems Review of Systems  Gastrointestinal:  Positive for vomiting.     Physical Exam Triage Vital Signs ED Triage Vitals  Enc Vitals Group     BP 07/10/22 1750 117/80     Pulse Rate 07/10/22 1750 (!) 107     Resp 07/10/22 1750 20     Temp 07/10/22 1750 98.8 F (37.1 C)     Temp Source 07/10/22 1750 Oral     SpO2 07/10/22 1750 95 %     Weight --      Height --      Head Circumference --      Peak Flow --  Pain Score 07/10/22 1747 8     Pain Loc --      Pain Edu? --      Excl. in [place]? --    No data found.  Updated Vital Signs BP 117/80 (BP Location: Left Arm)   Pulse (!) 107   Temp 98.8 F (37.1 C) (Oral)   Resp 20   SpO2 95%   Breastfeeding No   Visual Acuity Right Eye Distance:   Left Eye Distance:   Bilateral Distance:    Right Eye Near:   Left Eye Near:    Bilateral Near:     Physical Exam Vitals reviewed.  Constitutional:      General: She is not in acute distress.    Appearance: She is not toxic-appearing.  HENT:     Nose: Nose normal.     Mouth/Throat:     Mouth: Mucous membranes are moist.     Pharynx: No oropharyngeal exudate or posterior oropharyngeal erythema.  Eyes:     Extraocular Movements: Extraocular movements intact.     Conjunctiva/sclera: Conjunctivae normal.     Pupils: Pupils are equal, round, and reactive to light.  Cardiovascular:     Rate and Rhythm: Normal rate and regular rhythm.     Heart sounds: No murmur heard. Pulmonary:     Effort: Pulmonary effort is normal. No respiratory distress.     Breath sounds: No stridor. No wheezing, rhonchi or rales.   Abdominal:     Palpations: Abdomen is soft.     Comments: Generalized tenderness  Musculoskeletal:     Cervical back: Neck supple.  Lymphadenopathy:     Cervical: No cervical adenopathy.  Skin:    Capillary Refill: Capillary refill takes less than 2 seconds.     Coloration: Skin is not jaundiced or pale.  Neurological:     General: No focal deficit present.     Mental Status: She is alert and oriented to person, place, and time.  Psychiatric:        Behavior: Behavior normal.      UC Treatments / Results  Labs (all labs ordered are listed, but only abnormal results are displayed) Labs Reviewed  POCT URINE PREGNANCY    EKG   Radiology No results found.  Procedures Procedures (including critical care time)  Medications Ordered in UC Medications  ondansetron (ZOFRAN) injection 4 mg (4 mg Intramuscular Given 07/10/22 1757)    Initial Impression / Assessment and Plan / UC Course  I have reviewed the triage vital signs and the nursing notes.  Pertinent labs & imaging results that were available during my care of the patient were reviewed by me and considered in my medical decision making (see chart for details).        UPT is negative.  After the Zofran injection her nausea is easing up, and she feels improved some.  Since the Zofran and ODT did not work at home, some Phenergan tablets are sent into the pharmacy.  We discussed clear liquids and bland food  If she continues to vomit despite all this medication, she is to proceed to the emergency room for further evaluation Final Clinical Impressions(s) / UC Diagnoses   Final diagnoses:  Viral gastroenteritis     Discharge Instructions      The pregnancy test is negative  You are given an injection of Zofran 4 mg here in the office  Promethazine 25 mg tablets--take 1 every 6 hours as needed for nausea.  If you continue to  vomit despite the medications, please proceed to the emergency room for further  evaluation     ED Prescriptions     Medication Sig Dispense Auth. Provider   promethazine (PHENERGAN) 25 MG tablet Take 1 tablet (25 mg total) by mouth every 6 (six) hours as needed for nausea or vomiting. 5 tablet Alfonza Toft, Gwenlyn Perking, MD      PDMP not reviewed this encounter.   Barrett Henle, MD 07/10/22 [phone]

## 2022-07-10 NOTE — ED Triage Notes (Signed)
Pt started feeling nauseated yesterday afternoon, vomiting started today. Not able to keep anything down at this point. Diarrhea also started this afternoon. Chest hurts from vomiting. Took Zofran ODT this morning but did not help.

## 2022-07-10 NOTE — Discharge Instructions (Signed)
The pregnancy test is negative  You are given an injection of Zofran 4 mg here in the office  Promethazine 25 mg tablets--take 1 every 6 hours as needed for nausea.  If you continue to vomit despite the medications, please proceed to the emergency room for further evaluation

## 2022-11-04 ENCOUNTER — Encounter: Payer: Self-pay | Admitting: *Deleted

## 2022-11-04 ENCOUNTER — Other Ambulatory Visit: Payer: Self-pay

## 2022-11-04 ENCOUNTER — Ambulatory Visit
Admission: EM | Admit: 2022-11-04 | Discharge: 2022-11-04 | Disposition: A | Discharge status: Home / Self Care | Payer: Medicaid Other | Attending: Physician Assistant | Admitting: Physician Assistant

## 2022-11-04 DIAGNOSIS — Z3490 Encounter for supervision of normal pregnancy, unspecified, unspecified trimester: Secondary | ICD-10-CM | POA: Diagnosis not present

## 2022-11-04 LAB — POCT URINE PREGNANCY: Preg Test, Ur: POSITIVE — AB

## 2022-11-04 NOTE — Discharge Instructions (Signed)
Please make appointment with OB/GYN as soon as possible.

## 2022-11-04 NOTE — ED Provider Notes (Signed)
[name]    CSN: [id number] Arrival date & time: 11/04/22  4098      History   Chief Complaint Chief Complaint  Patient presents with   Nausea    HPI Cindy Welch is a 27 y.o. female.   Patient here today for evaluation of nausea and decreased appetite over the last couple weeks.  She reports that over the last 4 days symptoms have seemed to be more significant and consistent.  She has not any vomiting.  She reports occasional mild abdominal cramping but nothing severe or persistent.  She has not had any diarrhea.  She denies any fever.  She notes she did take pregnancy test at home 3 days ago which was "inconclusive".  LMP was 10/15/2022.   The history is provided by the patient.    Past Medical History:  Diagnosis Date   Asthma     Patient Active Problem List   Diagnosis Date Noted   Gestational hypertension, third trimester 04/17/2021   Indication for care in labor or delivery 04/17/2021   Abnormal chromosomal and genetic finding on antenatal screening of mother 10/19/2020   Anemia affecting pregnancy in first trimester 10/12/2020   Supervision of normal first pregnancy, antepartum 09/03/2020    History reviewed. No pertinent surgical history.  OB History     Gravida  1   Para  1   Term  1   Preterm      AB      Living  1      SAB      IAB      Ectopic      Multiple  0   Live Births  1            Home Medications    Prior to Admission medications   Medication Sig Start Date End Date Taking? Authorizing Provider  albuterol (PROVENTIL HFA;VENTOLIN HFA) 108 (90 Base) MCG/ACT inhaler Inhale 1-2 puffs into the lungs every 6 (six) hours as needed for wheezing or shortness of breath.    [provider]  promethazine (PHENERGAN) 25 MG tablet Take 1 tablet (25 mg total) by mouth every 6 (six) hours as needed for nausea or vomiting. 07/10/22   Marlinda Mike, Janace Aris, MD    Family History Family History  Problem  Relation Age of Onset   Asthma Father    Lupus Maternal Aunt    Cancer Paternal Aunt    Lupus Maternal Grandmother     Social History Social History   Tobacco Use   Smoking status: Never    Passive exposure: Yes   Smokeless tobacco: Never  Vaping Use   Vaping status: Never Used  Substance Use Topics   Alcohol use: Yes    Comment: socially   Drug use: Not Currently    Types: Marijuana     Allergies   Patient has no known allergies.   Review of Systems Review of Systems  Constitutional:  Positive for appetite change. Negative for chills and fever.  Eyes:  Negative for discharge and redness.  Respiratory:  Negative for shortness of breath.   Gastrointestinal:  Positive for abdominal pain and nausea. Negative for vomiting.  Genitourinary:  Negative for menstrual problem and vaginal bleeding.     Physical Exam Triage Vital Signs ED Triage Vitals  Encounter Vitals Group     BP      Systolic BP Percentile      Diastolic BP Percentile      Pulse  Resp      Temp      Temp src      SpO2      Weight      Height      Head Circumference      Peak Flow      Pain Score      Pain Loc      Pain Education      Exclude from Growth Chart    No data found.  Updated Vital Signs BP (!) 145/87   Pulse 70   Temp 97.6 F (36.4 C) (Oral)   Resp 16   LMP 10/15/2022 (Exact Date)   SpO2 98%   Breastfeeding No      Physical Exam Vitals and nursing note reviewed.  Constitutional:      General: She is not in acute distress.    Appearance: Normal appearance. She is not ill-appearing.  HENT:     Head: Normocephalic and atraumatic.  Eyes:     Conjunctiva/sclera: Conjunctivae normal.  Cardiovascular:     Rate and Rhythm: Normal rate and regular rhythm.  Pulmonary:     Effort: Pulmonary effort is normal. No respiratory distress.     Breath sounds: No wheezing, rhonchi or rales.  Neurological:     Mental Status: She is alert.  Psychiatric:        Mood and Affect:  Mood normal.        Behavior: Behavior normal.        Thought Content: Thought content normal.      UC Treatments / Results  Labs (all labs ordered are listed, but only abnormal results are displayed) Labs Reviewed  POCT URINE PREGNANCY - Abnormal; Notable for the following components:      Result Value   Preg Test, Ur Positive (*)    All other components within normal limits    EKG   Radiology No results found.  Procedures Procedures (including critical care time)  Medications Ordered in UC Medications - No data to display  Initial Impression / Assessment and Plan / UC Course  I have reviewed the triage vital signs and the nursing notes.  Pertinent labs & imaging results that were available during my care of the patient were reviewed by me and considered in my medical decision making (see chart for details).    Discussed positive pregnancy screening in office.  Recommended follow-up with OB as soon as possible.  Advise sooner follow-up in the ED with any worsening or significant abdominal pain, vaginal bleeding or other concerns.  Patient expresses understanding.  Final Clinical Impressions(s) / UC Diagnoses   Final diagnoses:  Pregnancy, unspecified gestational age     Discharge Instructions      Please make appointment with OB/GYN as soon as possible.      ED Prescriptions   None    PDMP not reviewed this encounter.   Tomi Bamberger, PA-C 11/04/22 1014

## 2022-11-04 NOTE — ED Triage Notes (Addendum)
C/O nausea and poor appetite over past week; states nausea has become more consistent over past 4 days, but denies vomiting. C/O intermittent abd cramping. Denies diarrhea. Denies fevers. States had home pregnancy test 3 days ago that read "inconclusive".

## 2022-11-24 ENCOUNTER — Telehealth (INDEPENDENT_AMBULATORY_CARE_PROVIDER_SITE_OTHER): Payer: Medicaid Other

## 2022-11-24 DIAGNOSIS — Z3490 Encounter for supervision of normal pregnancy, unspecified, unspecified trimester: Secondary | ICD-10-CM | POA: Insufficient documentation

## 2022-11-24 DIAGNOSIS — Z3480 Encounter for supervision of other normal pregnancy, unspecified trimester: Secondary | ICD-10-CM

## 2022-11-24 NOTE — Progress Notes (Signed)
New OB Intake  I connected with Cindy Welch  on 11/24/22 at  2:15 PM EDT by MyChart Video Visit and verified that I am speaking with the correct person using two identifiers. Nurse is located at [facility] and pt is located at home.  I discussed the limitations, risks, security and privacy concerns of performing an evaluation and management service by telephone and the availability of in person appointments. I also discussed with the patient that there may be a patient responsible charge related to this service. The patient expressed understanding and agreed to proceed.  I explained I am completing New OB Intake today. We discussed EDD of 07/03/2023, by Last Menstrual Period. Pt is G2P1001. I reviewed her allergies, medications and Medical/Surgical/OB history.    Patient Active Problem List   Diagnosis Date Noted   Gestational hypertension, third trimester 04/17/2021   Indication for care in labor or delivery 04/17/2021   Abnormal chromosomal and genetic finding on antenatal screening of mother 10/19/2020   Anemia affecting pregnancy in first trimester 10/12/2020   Supervision of normal first pregnancy, antepartum 09/03/2020    Concerns addressed today  Delivery Plans Plans to deliver at [facility]. Discussed the nature of our practice with multiple providers including residents and students. Due to the size of the practice, the delivering provider may not be the same as those providing prenatal care.   Patient is interested in water birth. Offered upcoming OB visit with CNM to discuss further.  MyChart/Babyscripts MyChart access verified. I explained pt will have some visits in office and some virtually. Babyscripts instructions given and order placed. Patient verifies receipt of registration text/e-mail. Account successfully created and app downloaded.  Blood Pressure Cuff/Weight Scale Patient has blood pressure cuff. Explained after first prenatal appt pt will check weekly and  document in Babyscripts.  Anatomy US Explained first scheduled [place] will be around 19 weeks. Anatomy US scheduled for 02/02/2023 at 8:15am.  Is patient a CenteringPregnancy candidate?  Declined Declined due to Declined to say    Is patient a Mom+Baby Combined Care candidate?  Not a candidate   If accepted, confirm patient does not intend to move from the area for at least 12 months, then notify Mom+Baby staff  Interested in [place]? If yes, send referral and doula dot phrase.   Is patient a candidate for Babyscripts Optimization? Yes  First visit review I reviewed new OB appt with patient. Explained pt will be seen by Dr. Shawnie Pons at first visit. Discussed Avelina Laine genetic screening with patient.  Panorama and Horizon.. Routine prenatal labs is needed at new ob visit.  Last Pap Diagnosis  Date Value Ref Range Status  10/02/2020   Final   - Negative for intraepithelial lesion or malignancy (NILM)    B'Aisha T , CMA 11/24/2022  2:08 PM

## 2022-11-24 NOTE — Patient Instructions (Addendum)
Safe Medications in Pregnancy   Acne:  Benzoyl Peroxide  Salicylic Acid   Backache/Headache:  Tylenol: 2 regular strength every 4 hours OR               2 Extra strength every 6 hours   Colds/Coughs/Allergies:  Benadryl (alcohol free) 25 mg every 6 hours as needed  Breath right strips  Claritin  Cepacol throat lozenges  Chloraseptic throat spray  Cold-Eeze- up to three times per day  Cough drops, alcohol free  Flonase (by prescription only)  Guaifenesin  Mucinex  Robitussin DM (plain only, alcohol free)  Saline nasal spray/drops  Sudafed (pseudoephedrine) & Actifed * use only after [redacted] weeks gestation and if you do not have high blood pressure  Tylenol  Vicks Vaporub  Zinc lozenges  Zyrtec   Constipation:  Colace  Ducolax suppositories  Fleet enema  Glycerin suppositories  Metamucil  Milk of magnesia  Miralax  Senokot  Smooth move tea   Diarrhea:  Kaopectate  Imodium A-D   *NO pepto Bismol   Hemorrhoids:  Anusol  Anusol HC  Preparation H  Tucks   Indigestion:  Tums  Maalox  Mylanta  Zantac  Pepcid   Insomnia:  Benadryl (alcohol free) 25mg every 6 hours as needed  Tylenol PM  Unisom, no Gelcaps   Leg Cramps:  Tums  MagGel   Nausea/Vomiting:  Bonine  Dramamine  Emetrol  Ginger extract  Sea bands  Meclizine  Nausea medication to take during pregnancy:  Unisom (doxylamine succinate 25 mg tablets) Take one tablet daily at bedtime. If symptoms are not adequately controlled, the dose can be increased to a maximum recommended dose of two tablets daily (1/2 tablet in the morning, 1/2 tablet mid-afternoon and one at bedtime).  Vitamin B6 100mg tablets. Take one tablet twice a day (up to 200 mg per day).   Skin Rashes:  Aveeno products  Benadryl cream or 25mg every 6 hours as needed  Calamine Lotion  1% cortisone cream   Yeast infection:  Gyne-lotrimin 7  Monistat 7    **If taking multiple medications, please check labels to avoid  duplicating the same active ingredients  **take medication as directed on the label  ** Do not exceed 4000 mg of tylenol in 24 hours  **Do not take medications that contain aspirin or ibuprofen             Considering Waterbirth? Guide for patients at [facility] ([facility]) Why consider waterbirth? Gentle birth for babies  Less pain medicine used in labor  May allow for passive descent/less pushing  May reduce perineal tears  More mobility and instinctive maternal position changes  Increased maternal relaxation   Is waterbirth safe? What are the risks of infection, drowning or other complications? Infection:  Very low risk (3.7 % for tub vs 4.8% for bed)  7 in 8000 waterbirths with documented infection  Poorly cleaned equipment most common cause  Slightly lower group B strep transmission rate  Drowning  Maternal:  Very low risk  Related to seizures or fainting  Newborn:  Very low risk. No evidence of increased risk of respiratory problems in multiple large studies  Physiological protection from breathing under water  Avoid underwater birth if there are any fetal complications  Once baby's head is out of the water, keep it out.  Birth complication  Some reports of cord trauma, but risk decreased by bringing baby to surface gradually  No evidence of increased risk of shoulder dystocia.   Mothers can usually change positions faster in water than in a bed, possibly aiding the maneuvers to free the shoulder.   There are 2 things you MUST do to have a waterbirth with [facility]: Attend a waterbirth class at [facility] [place] [facility]   3rd Wednesday of every month from 7-9 pm (virtual during COVID) Free Register online at [email or web address] or [email or web address] or by calling [phone] Bring us the certificate from the class to your prenatal appointment or send via MyChart Meet with a midwife at 36 weeks* to see if you can still plan a  waterbirth and to sign the consent.   *We also recommend that you schedule as many of your prenatal visits with a midwife as possible.    Helpful information: You may want to bring a bathing suit top to the hospital to wear during labor but this is optional.  All other supplies are provided by the hospital. Please arrive at the hospital with signs of active labor, and do not wait at home until late in labor. It takes 45 min- 1 hour for fetal monitoring, and check in to your room to take place, plus transport and filling of the waterbirth tub.    Things that would prevent you from having a waterbirth: Premature, <37wks  Previous cesarean birth  Presence of thick meconium-stained fluid  Multiple gestation (Twins, triplets, etc.)  Uncontrolled diabetes or gestational diabetes requiring medication  Hypertension diagnosed in pregnancy or preexisting hypertension (gestational hypertension, preeclampsia, or chronic hypertension) Fetal growth restriction (your baby measures less than 10th percentile on ultrasound) Heavy vaginal bleeding  Non-reassuring fetal heart rate  Active infection (MRSA, etc.). Group B Strep is NOT a contraindication for waterbirth.  If your labor has to be induced and induction method requires continuous monitoring of the baby's heart rate  Other risks/issues identified by your obstetrical provider   Please remember that birth is unpredictable. Under certain unforeseeable circumstances your provider may advise against giving birth in the tub. These decisions will be made on a case-by-case basis and with the safety of you and your baby as our highest priority.     

## 2022-11-30 ENCOUNTER — Encounter: Payer: Self-pay | Admitting: Family Medicine

## 2022-11-30 ENCOUNTER — Other Ambulatory Visit: Payer: Self-pay

## 2022-11-30 ENCOUNTER — Ambulatory Visit (INDEPENDENT_AMBULATORY_CARE_PROVIDER_SITE_OTHER): Payer: Medicaid Other | Admitting: Family Medicine

## 2022-11-30 ENCOUNTER — Other Ambulatory Visit (HOSPITAL_COMMUNITY)
Admission: RE | Admit: 2022-11-30 | Discharge: 2022-11-30 | Disposition: A | Discharge status: Home / Self Care | Payer: Medicaid Other | Source: Ambulatory Visit | Attending: Family Medicine | Admitting: Family Medicine

## 2022-11-30 VITALS — BP 123/78 | HR 62 | Wt 183.3 lb

## 2022-11-30 DIAGNOSIS — Z3491 Encounter for supervision of normal pregnancy, unspecified, first trimester: Secondary | ICD-10-CM | POA: Insufficient documentation

## 2022-11-30 DIAGNOSIS — O285 Abnormal chromosomal and genetic finding on antenatal screening of mother: Secondary | ICD-10-CM | POA: Diagnosis not present

## 2022-11-30 DIAGNOSIS — J4520 Mild intermittent asthma, uncomplicated: Secondary | ICD-10-CM | POA: Diagnosis not present

## 2022-11-30 DIAGNOSIS — Z3A09 9 weeks gestation of pregnancy: Secondary | ICD-10-CM | POA: Diagnosis not present

## 2022-11-30 DIAGNOSIS — Z8759 Personal history of other complications of pregnancy, childbirth and the puerperium: Secondary | ICD-10-CM | POA: Diagnosis not present

## 2022-11-30 MED ORDER — ALBUTEROL SULFATE HFA 108 (90 BASE) MCG/ACT IN AERS: 1.0000 | INHALATION_SPRAY | Freq: Four times a day (QID) | RESPIRATORY_TRACT | 3 refills | Status: DC | PRN

## 2022-11-30 MED ORDER — ASPIRIN 81 MG PO TBEC: 81.0000 mg | DELAYED_RELEASE_TABLET | Freq: Every day | ORAL | 3 refills | Status: DC

## 2022-11-30 NOTE — Progress Notes (Signed)
Subjective:   Cindy Welch is a 27 y.o. G2P1001 at [redacted]w[redacted]d by LMP being seen today for her first obstetrical visit.  Her obstetrical history is significant for pregnancy induced hypertension. Patient does not intend to breast feed. Pregnancy history fully reviewed.  Patient reports no complaints.  HISTORY: OB History  Gravida Para Term Preterm AB Living  2 1 1  0 0 1  SAB IAB Ectopic Multiple Live Births  0 0 0 0 1    # Outcome Date GA Lbr Len/2nd Weight Sex Type Anes PTL Lv  2 Current           1 Term 04/18/21 [redacted]w[redacted]d 06:27 / 00:27 5 lb 7 oz (2.466 kg) F Vag-Spont EPI  LIV     Name: LIANA, DEENER     Apgar1: 9  Apgar5: 9   Last pap smear was  09/2020 and was normal Past Medical History:  Diagnosis Date   Asthma    Supervision of normal first pregnancy, antepartum 09/03/2020              Nursing Staff    Provider      Office Location     [facility]    Dating     LMP      Language     English    Anatomy US     Normal girl      Flu Vaccine     Declined    Genetic/Carrier Screen     NIPS: LR Female   AFP: Negative  Horizon: 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 No pertinent surgical history. Family History  Problem Relation Age of Onset   Hypertension Mother    Heart failure Mother    Asthma Father    Lupus Maternal Aunt    Cancer Paternal Aunt        breast   Lupus Maternal Grandmother    Social History   Tobacco Use   Smoking status: Never    Passive exposure: Yes   Smokeless tobacco: Never  Vaping Use   Vaping status: Never Used  Substance Use Topics   Alcohol use: Not Currently    Comment: socially   Drug use: Not Currently    Types: Marijuana   No Known Allergies Current Outpatient Medications on File Prior to Visit  Medication Sig Dispense Refill   promethazine (PHENERGAN) 25 MG tablet Take 1 tablet (25 mg total)  by mouth every 6 (six) hours as needed for nausea or vomiting. 5 tablet 0   No current facility-administered medications on file prior to visit.     Exam   Vitals:   11/30/22 1049  BP: 123/78  Pulse: 62  Weight: 183 lb 4.8 oz (83.1 kg)      System: General: well-developed, well-nourished female in no acute distress   Breast:  normal appearance, no masses or tenderness   Skin: normal coloration and turgor, no rashes   Neurologic: oriented, normal, negative, normal mood   Extremities: normal strength, tone, and muscle mass, ROM of all joints is normal   HEENT PERRLA, extraocular movement intact and sclera clear, anicteric   Mouth/Teeth mucous membranes moist, pharynx normal without lesions and dental hygiene good   Neck supple  and no masses   Cardiovascular: regular rate and rhythm   Respiratory:  no respiratory distress, normal breath sounds   Abdomen: soft, non-tender; bowel sounds normal; no masses,  no organomegaly     Assessment:   Pregnancy: G2P1001 Patient Active Problem List   Diagnosis Date Noted   Previous baby with fetal growth restriction 11/30/2022   Supervision of low-risk pregnancy 11/24/2022   History of gestational hypertension 04/17/2021   Abnormal chromosomal and genetic finding on antenatal screening of mother 10/19/2020   Anemia affecting pregnancy in first trimester 10/12/2020     Plan:  1. Encounter for supervision of low-risk pregnancy in first trimester New OB labs today - Culture, OB Urine - GC/Chlamydia probe amp ([place])not at [facility] - CBC/D/Plt+RPR+Rh+ABO+RubIgG... - Protein / creatinine ratio, urine - Comp Met (CMET) - HgB A1c  2. History of gestational hypertension Begin Baby ASA @ 12 weeks for prevention Baseline labs - aspirin EC 81 MG tablet; Take 1 tablet (81 mg total) by mouth daily.  Dispense: 90 tablet; Refill: 3 - Protein / creatinine ratio, urine - Comp Met (CMET)  3. 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 Previous baby with fetal growth restriction 5 lb 7 oz @ 38 weeks in setting of HTN  5. Mild intermittent asthma without complication Refilled her inhaler - albuterol (VENTOLIN HFA) 108 (90 Base) MCG/ACT inhaler; Inhale 1-2 puffs into the lungs every 6 (six) hours as needed for wheezing or shortness of breath.  Dispense: 18 g; Refill: 3   Initial labs drawn. Continue prenatal vitamins. Genetic Screening discussed, NIPS: ordered. Ultrasound discussed; fetal anatomic survey: ordered. Problem list reviewed and updated.  Routine obstetric precautions reviewed. Return in 4 weeks (on 12/28/2022).

## 2022-11-30 NOTE — Addendum Note (Signed)
Addended by: Marjo Bicker on: 11/30/2022 02:24 PM   Modules accepted: Orders

## 2022-12-01 LAB — CBC/D/PLT+RPR+RH+ABO+RUBIGG...
Antibody Screen: NEGATIVE
Basophils Absolute: 0 10*3/uL (ref 0.0–0.2)
Basos: 1 %
EOS (ABSOLUTE): 0 10*3/uL (ref 0.0–0.4)
Eos: 1 %
HCV Ab: NONREACTIVE
HIV Screen 4th Generation wRfx: NONREACTIVE
Hematocrit: 33.8 % — ABNORMAL LOW (ref 34.0–46.6)
Hemoglobin: 11 g/dL — ABNORMAL LOW (ref 11.1–15.9)
Hepatitis B Surface Ag: NEGATIVE
Immature Grans (Abs): 0 10*3/uL (ref 0.0–0.1)
Immature Granulocytes: 0 %
Lymphocytes Absolute: 1.3 10*3/uL (ref 0.7–3.1)
Lymphs: 31 %
MCH: 32.1 pg (ref 26.6–33.0)
MCHC: 32.5 g/dL (ref 31.5–35.7)
MCV: 99 fL — ABNORMAL HIGH (ref 79–97)
Monocytes Absolute: 0.4 10*3/uL (ref 0.1–0.9)
Monocytes: 10 %
Neutrophils Absolute: 2.4 10*3/uL (ref 1.4–7.0)
Neutrophils: 57 %
Platelets: 205 10*3/uL (ref 150–450)
RBC: 3.43 x10E6/uL — ABNORMAL LOW (ref 3.77–5.28)
RDW: 11.2 % — ABNORMAL LOW (ref 11.7–15.4)
RPR Ser Ql: NONREACTIVE
Rh Factor: POSITIVE
Rubella Antibodies, IGG: 1.9 {index} (ref >0.99)
WBC: 4.2 10*3/uL (ref 3.4–10.8)

## 2022-12-01 LAB — COMPREHENSIVE METABOLIC PANEL
ALT: 10 IU/L (ref 0–32)
AST: 14 IU/L (ref 0–40)
Albumin: 4.2 g/dL (ref 4.0–5.0)
Alkaline Phosphatase: 44 IU/L (ref 44–121)
BUN/Creatinine Ratio: 15 (ref 9–23)
BUN: 8 mg/dL (ref 6–20)
Bilirubin Total: 0.3 mg/dL (ref 0.0–1.2)
CO2: 18 mmol/L — ABNORMAL LOW (ref 20–29)
Calcium: 9.4 mg/dL (ref 8.7–10.2)
Chloride: 101 mmol/L (ref 96–106)
Creatinine, Ser: 0.52 mg/dL — ABNORMAL LOW (ref 0.57–1.00)
Globulin, Total: 2.9 g/dL (ref 1.5–4.5)
Glucose: 88 mg/dL (ref 70–99)
Potassium: 3.4 mmol/L — ABNORMAL LOW (ref 3.5–5.2)
Sodium: 134 mmol/L (ref 134–144)
Total Protein: 7.1 g/dL (ref 6.0–8.5)
eGFR: 132 mL/min/{1.73_m2} (ref >59)

## 2022-12-01 LAB — HCV INTERPRETATION

## 2022-12-01 LAB — HEMOGLOBIN A1C
Est. average glucose Bld gHb Est-mCnc: 91 mg/dL
Hgb A1c MFr Bld: 4.8 % (ref 4.8–5.6)

## 2022-12-01 LAB — PROTEIN / CREATININE RATIO, URINE
Creatinine, Urine: 241.2 mg/dL
Protein, Ur: 20.7 mg/dL
Protein/Creat Ratio: 86 mg/g{creat} (ref 0–200)

## 2022-12-02 LAB — URINE CULTURE, OB REFLEX

## 2022-12-02 LAB — GC/CHLAMYDIA PROBE AMP (~~LOC~~) NOT AT ARMC
Chlamydia: NEGATIVE
Comment: NEGATIVE
Comment: NORMAL
Neisseria Gonorrhea: NEGATIVE

## 2022-12-02 LAB — CULTURE, OB URINE

## 2022-12-03 ENCOUNTER — Encounter: Payer: Self-pay | Admitting: *Deleted

## 2022-12-08 LAB — PANORAMA PRENATAL TEST FULL PANEL:PANORAMA TEST PLUS 5 ADDITIONAL MICRODELETIONS: FETAL FRACTION: 5.7

## 2022-12-28 ENCOUNTER — Encounter: Payer: Self-pay | Admitting: Obstetrics and Gynecology

## 2022-12-28 ENCOUNTER — Ambulatory Visit (INDEPENDENT_AMBULATORY_CARE_PROVIDER_SITE_OTHER): Payer: Medicaid Other | Admitting: Obstetrics and Gynecology

## 2022-12-28 ENCOUNTER — Other Ambulatory Visit: Payer: Self-pay

## 2022-12-28 VITALS — BP 123/77 | HR 80 | Wt 180.0 lb

## 2022-12-28 DIAGNOSIS — Z8759 Personal history of other complications of pregnancy, childbirth and the puerperium: Secondary | ICD-10-CM

## 2022-12-28 DIAGNOSIS — Z23 Encounter for immunization: Secondary | ICD-10-CM | POA: Diagnosis not present

## 2022-12-28 DIAGNOSIS — Z3492 Encounter for supervision of normal pregnancy, unspecified, second trimester: Secondary | ICD-10-CM

## 2022-12-28 DIAGNOSIS — Z3A13 13 weeks gestation of pregnancy: Secondary | ICD-10-CM

## 2022-12-28 DIAGNOSIS — Z3491 Encounter for supervision of normal pregnancy, unspecified, first trimester: Secondary | ICD-10-CM

## 2022-12-28 NOTE — Progress Notes (Signed)
Subjective:  Cindy Welch is a 27 y.o. G2P1001 at [redacted]w[redacted]d being seen today for ongoing prenatal care.  She is currently monitored for the following issues for this low-risk pregnancy and has History of gestational hypertension; Supervision of low-risk pregnancy; and Previous baby with fetal growth restriction on their problem list.  Patient reports no complaints.  Contractions: Not present. Vag. Bleeding: None.  Movement: Absent. Denies leaking of fluid.   The following portions of the patient's history were reviewed and updated as appropriate: allergies, current medications, past family history, past medical history, past social history, past surgical history and problem list. Problem list updated.  Objective:   Vitals:   12/28/22 1314  BP: 123/77  Pulse: 80  Weight: 81.6 kg    Fetal Status: Fetal Heart Rate (bpm): 155   Movement: Absent     General:  Alert, oriented and cooperative. Patient is in no acute distress.  Skin: Skin is warm and dry. No rash noted.   Cardiovascular: Normal heart rate noted  Respiratory: Normal respiratory effort, no problems with respiration noted  Abdomen: Soft, gravid, appropriate for gestational age. Pain/Pressure: Absent     Pelvic:  Cervical exam deferred        Extremities: Normal range of motion.  Edema: None  Mental Status: Normal mood and affect. Normal behavior. Normal judgment and thought content.   Urinalysis:      Assessment and Plan:  Pregnancy: G2P1001 at [redacted]w[redacted]d  1. Encounter for supervision of low-risk pregnancy in second trimester Stable - Flu vaccine trivalent PF, 6mos and older(Flulaval,Afluria,Fluarix,Fluzone)  2. History of gestational hypertension BP stable Continue with every day BASA  3. Previous baby with fetal growth restriction Anatomy scan next month  Preterm labor symptoms and general obstetric precautions including but not limited to vaginal bleeding, contractions, leaking of fluid and fetal movement were  reviewed in detail with the patient. Please refer to After Visit Summary for other counseling recommendations.  Return in about 4 weeks (around 01/25/2023) for OB visit, face to face, any provider.   Hermina Staggers, MD

## 2022-12-29 ENCOUNTER — Telehealth: Payer: Self-pay

## 2022-12-29 NOTE — Telephone Encounter (Signed)
Not accepting calls at this time - called to reschedule Detail, Anatomy ultrasound on 10/22 - needs moved to later in the day due to Staff Meeting.

## 2023-01-21 DIAGNOSIS — Z148 Genetic carrier of other disease: Secondary | ICD-10-CM | POA: Insufficient documentation

## 2023-01-26 ENCOUNTER — Encounter: Payer: Medicaid Other | Admitting: Certified Nurse Midwife

## 2023-02-02 ENCOUNTER — Other Ambulatory Visit: Payer: Self-pay

## 2023-02-02 ENCOUNTER — Other Ambulatory Visit: Payer: Self-pay | Admitting: *Deleted

## 2023-02-02 ENCOUNTER — Ambulatory Visit: Payer: Medicaid Other | Attending: Family Medicine

## 2023-02-02 ENCOUNTER — Ambulatory Visit: Payer: Medicaid Other

## 2023-02-02 DIAGNOSIS — Z148 Genetic carrier of other disease: Secondary | ICD-10-CM

## 2023-02-02 DIAGNOSIS — O99512 Diseases of the respiratory system complicating pregnancy, second trimester: Secondary | ICD-10-CM | POA: Insufficient documentation

## 2023-02-02 DIAGNOSIS — O09292 Supervision of pregnancy with other poor reproductive or obstetric history, second trimester: Secondary | ICD-10-CM | POA: Insufficient documentation

## 2023-02-02 DIAGNOSIS — O321XX0 Maternal care for breech presentation, not applicable or unspecified: Secondary | ICD-10-CM | POA: Insufficient documentation

## 2023-02-02 DIAGNOSIS — O1492 Unspecified pre-eclampsia, second trimester: Secondary | ICD-10-CM | POA: Insufficient documentation

## 2023-02-02 DIAGNOSIS — Z3A18 18 weeks gestation of pregnancy: Secondary | ICD-10-CM | POA: Diagnosis not present

## 2023-02-02 DIAGNOSIS — Z3480 Encounter for supervision of other normal pregnancy, unspecified trimester: Secondary | ICD-10-CM | POA: Insufficient documentation

## 2023-02-02 DIAGNOSIS — J45909 Unspecified asthma, uncomplicated: Secondary | ICD-10-CM | POA: Diagnosis not present

## 2023-02-02 DIAGNOSIS — Z8759 Personal history of other complications of pregnancy, childbirth and the puerperium: Secondary | ICD-10-CM

## 2023-02-02 DIAGNOSIS — Z363 Encounter for antenatal screening for malformations: Secondary | ICD-10-CM | POA: Diagnosis not present

## 2023-02-23 ENCOUNTER — Other Ambulatory Visit: Payer: Self-pay

## 2023-02-23 ENCOUNTER — Ambulatory Visit (INDEPENDENT_AMBULATORY_CARE_PROVIDER_SITE_OTHER): Payer: Medicaid Other | Admitting: Family Medicine

## 2023-02-23 VITALS — BP 106/71 | HR 76 | Wt 170.4 lb

## 2023-02-23 DIAGNOSIS — Z148 Genetic carrier of other disease: Secondary | ICD-10-CM

## 2023-02-23 DIAGNOSIS — Z8759 Personal history of other complications of pregnancy, childbirth and the puerperium: Secondary | ICD-10-CM

## 2023-02-23 DIAGNOSIS — Z3A21 21 weeks gestation of pregnancy: Secondary | ICD-10-CM

## 2023-02-23 DIAGNOSIS — Z3492 Encounter for supervision of normal pregnancy, unspecified, second trimester: Secondary | ICD-10-CM

## 2023-02-23 NOTE — Progress Notes (Signed)
   PRENATAL VISIT NOTE  Subjective:  Cindy Welch is a 27 y.o. G2P1001 at [redacted]w[redacted]d being seen today for ongoing prenatal care.  She is currently monitored for the following issues for this low-risk pregnancy and has History of gestational hypertension; Supervision of low-risk pregnancy; Previous baby with fetal growth restriction; and Carrier of spinal muscular atrophy on their problem list.  Patient doing well with no acute concerns today. She reports no complaints.  Contractions: Not present. Vag. Bleeding: None.  Movement: Present. Denies leaking of fluid.   The following portions of the patient's history were reviewed and updated as appropriate: allergies, current medications, past family history, past medical history, past social history, past surgical history and problem list. Problem list updated.  Objective:   Vitals:   02/23/23 1547  BP: 106/71  Pulse: 76  Weight: 170 lb 6.4 oz (77.3 kg)    Fetal Status: Fetal Heart Rate (bpm): 155   Movement: Present     General:  Alert, oriented and cooperative. Patient is in no acute distress.  Skin: Skin is warm and dry. No rash noted.   Cardiovascular: Normal heart rate noted  Respiratory: Normal respiratory effort, no problems with respiration noted  Abdomen: Soft, gravid, appropriate for gestational age.  Pain/Pressure: Absent     Pelvic: Cervical exam deferred        Extremities: Normal range of motion.  Edema: None  Mental Status:  Normal mood and affect. Normal behavior. Normal judgment and thought content.   Assessment and Plan:  Pregnancy: G2P1001 at [redacted]w[redacted]d  1. [redacted] weeks gestation of pregnancy 2. Encounter for supervision of low-risk pregnancy in second trimester - return in 4 weeks - growth [place] 12/17  3. Previous baby with fetal growth restriction - fundal height at the umbilicus however only measuring 19cm, will f/u per MFM guidelines for growth [place], scheduled as above   4. Carrier of spinal muscular atrophy - unsure of  FOB  - declined AFP today   5. History of gestational hypertension - CTM, normotensive today, asymptomatic  - instructed to increase ASA to 162mg  every day    Preterm labor symptoms and general obstetric precautions including but not limited to vaginal bleeding, contractions, leaking of fluid and fetal movement were reviewed in detail with the patient.  Please refer to After Visit Summary for other counseling recommendations.   Return in about 4 weeks (around 03/23/2023) for LOB.   Mittie Bodo, MD Family Medicine - Obstetrics Fellow

## 2023-03-22 ENCOUNTER — Ambulatory Visit (INDEPENDENT_AMBULATORY_CARE_PROVIDER_SITE_OTHER): Payer: Medicaid Other | Admitting: Obstetrics and Gynecology

## 2023-03-22 VITALS — BP 126/71 | HR 64 | Wt 170.0 lb

## 2023-03-22 DIAGNOSIS — Z8759 Personal history of other complications of pregnancy, childbirth and the puerperium: Secondary | ICD-10-CM

## 2023-03-22 DIAGNOSIS — O09292 Supervision of pregnancy with other poor reproductive or obstetric history, second trimester: Secondary | ICD-10-CM

## 2023-03-22 DIAGNOSIS — Z3A25 25 weeks gestation of pregnancy: Secondary | ICD-10-CM

## 2023-03-22 DIAGNOSIS — Z3492 Encounter for supervision of normal pregnancy, unspecified, second trimester: Secondary | ICD-10-CM

## 2023-03-22 NOTE — Progress Notes (Signed)
   PRENATAL VISIT NOTE  Subjective:  Cindy Welch is a 27 y.o. G2P1001 at [redacted]w[redacted]d being seen today for ongoing prenatal care.  She is currently monitored for the following issues for this low-risk pregnancy and has History of gestational hypertension; Supervision of low-risk pregnancy; Previous baby with fetal growth restriction; and Carrier of spinal muscular atrophy on their problem list.  Patient reports no complaints.  Contractions: Not present. Vag. Bleeding: None.  Movement: Present. Denies leaking of fluid.   The following portions of the patient's history were reviewed and updated as appropriate: allergies, current medications, past family history, past medical history, past social history, past surgical history and problem list.   Objective:   Vitals:   03/22/23 1332  BP: 126/71  Pulse: 64  Weight: 170 lb (77.1 kg)    Fetal Status: Fetal Heart Rate (bpm): 156 Fundal Height: 25 cm Movement: Present     General:  Alert, oriented and cooperative. Patient is in no acute distress.  Skin: Skin is warm and dry. No rash noted.   Cardiovascular: Normal heart rate noted  Respiratory: Normal respiratory effort, no problems with respiration noted  Abdomen: Soft, gravid, appropriate for gestational age.  Pain/Pressure: Present     Pelvic: Cervical exam deferred        Extremities: Normal range of motion.  Edema: None  Mental Status: Normal mood and affect. Normal behavior. Normal judgment and thought content.   Assessment and Plan:  Pregnancy: G2P1001 at [redacted]w[redacted]d 1. Encounter for supervision of low-risk pregnancy in second trimester BP and FHR normal Doing well, feeling regular movement    2. History of gestational hypertension Normotensive, continue ASA  3. Previous baby with fetal growth restriction 10/22 u/s EFW 74%, afi normal , follow up growth 12/17  4. [redacted] weeks gestation of pregnancy Anticipatory guidance regarding gtt and labs next visit   Preterm labor symptoms and  general obstetric precautions including but not limited to vaginal bleeding, contractions, leaking of fluid and fetal movement were reviewed in detail with the patient. Please refer to After Visit Summary for other counseling recommendations.   Return in about 3 weeks (around 04/12/2023) for OB VISIT (MD or APP).  Future Appointments  Date Time Provider Department Center  03/30/2023  9:15 AM [facility] NURSE [facility]  03/30/2023  9:30 AM [facility] US1 [facility]    Albertine Grates, FNP

## 2023-03-30 ENCOUNTER — Ambulatory Visit: Payer: Medicaid Other

## 2023-03-30 ENCOUNTER — Ambulatory Visit: Payer: Medicaid Other | Attending: Obstetrics and Gynecology | Admitting: *Deleted

## 2023-03-30 ENCOUNTER — Other Ambulatory Visit: Payer: Self-pay | Admitting: *Deleted

## 2023-03-30 ENCOUNTER — Other Ambulatory Visit: Payer: Self-pay

## 2023-03-30 VITALS — BP 125/77

## 2023-03-30 DIAGNOSIS — J45909 Unspecified asthma, uncomplicated: Secondary | ICD-10-CM | POA: Insufficient documentation

## 2023-03-30 DIAGNOSIS — O99512 Diseases of the respiratory system complicating pregnancy, second trimester: Secondary | ICD-10-CM | POA: Diagnosis not present

## 2023-03-30 DIAGNOSIS — Z148 Genetic carrier of other disease: Secondary | ICD-10-CM | POA: Diagnosis not present

## 2023-03-30 DIAGNOSIS — O09292 Supervision of pregnancy with other poor reproductive or obstetric history, second trimester: Secondary | ICD-10-CM | POA: Diagnosis not present

## 2023-03-30 DIAGNOSIS — Z87898 Personal history of other specified conditions: Secondary | ICD-10-CM

## 2023-03-30 DIAGNOSIS — Z8759 Personal history of other complications of pregnancy, childbirth and the puerperium: Secondary | ICD-10-CM | POA: Insufficient documentation

## 2023-03-30 DIAGNOSIS — Z362 Encounter for other antenatal screening follow-up: Secondary | ICD-10-CM | POA: Diagnosis not present

## 2023-03-30 DIAGNOSIS — O285 Abnormal chromosomal and genetic finding on antenatal screening of mother: Secondary | ICD-10-CM

## 2023-03-30 DIAGNOSIS — Z3492 Encounter for supervision of normal pregnancy, unspecified, second trimester: Secondary | ICD-10-CM

## 2023-03-30 DIAGNOSIS — Z3A26 26 weeks gestation of pregnancy: Secondary | ICD-10-CM

## 2023-04-13 ENCOUNTER — Other Ambulatory Visit: Payer: Self-pay

## 2023-04-13 ENCOUNTER — Ambulatory Visit (INDEPENDENT_AMBULATORY_CARE_PROVIDER_SITE_OTHER): Payer: Medicaid Other | Admitting: Certified Nurse Midwife

## 2023-04-13 VITALS — BP 118/69 | HR 84 | Wt 176.0 lb

## 2023-04-13 DIAGNOSIS — Z8759 Personal history of other complications of pregnancy, childbirth and the puerperium: Secondary | ICD-10-CM

## 2023-04-13 DIAGNOSIS — Z3A28 28 weeks gestation of pregnancy: Secondary | ICD-10-CM

## 2023-04-13 DIAGNOSIS — Z3403 Encounter for supervision of normal first pregnancy, third trimester: Secondary | ICD-10-CM

## 2023-04-13 NOTE — Progress Notes (Signed)
   PRENATAL VISIT NOTE  Subjective:  Cindy Welch is a 27 y.o. G2P1001 at [redacted]w[redacted]d being seen today for ongoing prenatal care.  She is currently monitored for the following issues for this low-risk pregnancy and has History of gestational hypertension; Supervision of low-risk pregnancy; Previous baby with fetal growth restriction; and Carrier of spinal muscular atrophy on their problem list.  Patient reports no complaints.  Contractions: Not present. Vag. Bleeding: None.  Movement: Present. Denies leaking of fluid.   The following portions of the patient's history were reviewed and updated as appropriate: allergies, current medications, past family history, past medical history, past social history, past surgical history and problem list.   Objective:   Vitals:   04/13/23 0946  BP: 118/69  Pulse: 84  Weight: 176 lb (79.8 kg)    Fetal Status: Fetal Heart Rate (bpm): 151   Movement: Present     General:  Alert, oriented and cooperative. Patient is in no acute distress.  Skin: Skin is warm and dry. No rash noted.   Cardiovascular: Normal heart rate noted  Respiratory: Normal respiratory effort, no problems with respiration noted  Abdomen: Soft, gravid, appropriate for gestational age.  Pain/Pressure: Absent     Pelvic: Cervical exam deferred        Extremities: Normal range of motion.  Edema: None  Mental Status: Normal mood and affect. Normal behavior. Normal judgment and thought content.   Assessment and Plan:  Pregnancy: G2P1001 at [redacted]w[redacted]d 1. Encounter for supervision of normal first pregnancy in third trimester (Primary) - Doing well, feeling regular and vigorous fetal movement   2. [redacted] weeks gestation of pregnancy - 3T labs - Counseled on Tdap. Declines at this time, states she is interested next visit.   3. History of gestational hypertension - Taking low dose ASA. - BP readings WNL here.   4. Previous baby with fetal growth restriction - Fundal height appropriate, 1 cm  less than gestational age. - Will follow trend and consider growth US  PRN. One is currently scheduled 06/07/22.   Preterm labor symptoms and general obstetric precautions including but not limited to vaginal bleeding, contractions, leaking of fluid and fetal movement were reviewed in detail with the patient. Please refer to After Visit Summary for other counseling recommendations.   Return in about 2 weeks (around 04/27/2023) for LOB.  Future Appointments  Date Time Provider Department Center  06/08/2023  9:30 AM [facility] US3 [facility]    Camie DELENA Rote, CNM

## 2023-04-14 LAB — RPR: RPR Ser Ql: NONREACTIVE

## 2023-04-14 LAB — CBC
Hematocrit: 29.4 % — ABNORMAL LOW (ref 34.0–46.6)
Hemoglobin: 9.5 g/dL — ABNORMAL LOW (ref 11.1–15.9)
MCH: 33.5 pg — ABNORMAL HIGH (ref 26.6–33.0)
MCHC: 32.3 g/dL (ref 31.5–35.7)
MCV: 104 fL — ABNORMAL HIGH (ref 79–97)
Platelets: 272 10*3/uL (ref 150–450)
RBC: 2.84 x10E6/uL — ABNORMAL LOW (ref 3.77–5.28)
RDW: 11.4 % — ABNORMAL LOW (ref 11.7–15.4)
WBC: 6.8 10*3/uL (ref 3.4–10.8)

## 2023-04-14 LAB — GLUCOSE TOLERANCE, 2 HOURS W/ 1HR
Glucose, 1 hour: 67 mg/dL — ABNORMAL LOW (ref 70–179)
Glucose, 2 hour: 63 mg/dL — ABNORMAL LOW (ref 70–152)
Glucose, Fasting: 81 mg/dL (ref 70–91)

## 2023-04-14 LAB — HIV ANTIBODY (ROUTINE TESTING W REFLEX): HIV Screen 4th Generation wRfx: NONREACTIVE

## 2023-04-14 NOTE — L&D Delivery Note (Signed)
 Patient: PIPER HASSEBROCK MRN: [id number]  GBS status: neg  Patient is a 28 y.o. now G2P2 s/p NSVD at [redacted]w[redacted]d, who was admitted for [facility]. SROM 0h 39m prior to delivery with clear fluid.    Delivery Note At 2:49 PM a viable and healthy female was delivered via Vaginal, Spontaneous (Presentation: Left Occiput Anterior).  APGAR: 8, 9; weight 5 lb 11 oz (2580 g).   Placenta status: Spontaneous, Intact.  Cord: 3 vessels with the following complications: None.  Cord pH: n/a  Anesthesia: Epidural Episiotomy: None Lacerations: 1st degree Suture Repair:  no repair Est. Blood Loss (mL): 158  Mom to postpartum.  Baby to Couplet care / Skin to Skin.  Scheryl Darter 06/19/2023, 6:18 PM     Head delivered LOA. Body cord present. Shoulder and body delivered in usual fashion. Infant with spontaneous cry, placed on mother's abdomen, dried and bulb suctioned. Cord clamped x 2 after 1-minute delay, and cut by family member. Cord blood drawn. Placenta delivered spontaneously with gentle cord traction. Fundus firm with massage and Pitocin. Perineum inspected and found to have small right vaginal laceration, which was found to be hemostatic.

## 2023-04-16 ENCOUNTER — Encounter: Payer: Self-pay | Admitting: Certified Nurse Midwife

## 2023-04-27 ENCOUNTER — Other Ambulatory Visit: Payer: Self-pay

## 2023-04-27 ENCOUNTER — Ambulatory Visit (INDEPENDENT_AMBULATORY_CARE_PROVIDER_SITE_OTHER): Payer: Medicaid Other | Admitting: Obstetrics and Gynecology

## 2023-04-27 VITALS — BP 121/76 | HR 98 | Wt 173.0 lb

## 2023-04-27 DIAGNOSIS — Z3A30 30 weeks gestation of pregnancy: Secondary | ICD-10-CM

## 2023-04-27 DIAGNOSIS — R197 Diarrhea, unspecified: Secondary | ICD-10-CM

## 2023-04-27 DIAGNOSIS — Z8759 Personal history of other complications of pregnancy, childbirth and the puerperium: Secondary | ICD-10-CM

## 2023-04-27 DIAGNOSIS — O99013 Anemia complicating pregnancy, third trimester: Secondary | ICD-10-CM

## 2023-04-27 MED ORDER — LOPERAMIDE HCL 2 MG PO CAPS: 2.0000 mg | ORAL_CAPSULE | ORAL | 0 refills | Status: DC | PRN

## 2023-04-27 NOTE — Progress Notes (Signed)
   PRENATAL VISIT NOTE  Subjective:  Cindy Welch is a 28 y.o. G2P1001 at [redacted]w[redacted]d being seen today for ongoing prenatal care.  She is currently monitored for the following issues for this high-risk pregnancy and has Anemia during pregnancy in third trimester; History of gestational hypertension; Supervision of low-risk pregnancy; Previous baby with fetal growth restriction; and Carrier of spinal muscular atrophy on their problem list.  Patient reports  diarrhea and weight loss after eating fast food .  Contractions: Not present. Vag. Bleeding: None.  Movement: Present. Denies leaking of fluid.   The following portions of the patient's history were reviewed and updated as appropriate: allergies, current medications, past family history, past medical history, past social history, past surgical history and problem list.   Objective:   Vitals:   04/27/23 1124  BP: 121/76  Pulse: 98  Weight: 173 lb (78.5 kg)    Fetal Status: Fetal Heart Rate (bpm): 174   Movement: Present     General:  Alert, oriented and cooperative. Patient is in no acute distress.  Skin: Skin is warm and dry. No rash noted.   Cardiovascular: Normal heart rate noted  Respiratory: Normal respiratory effort, no problems with respiration noted  Abdomen: Soft, gravid, appropriate for gestational age.  Pain/Pressure: Absent     Pelvic: Cervical exam deferred        Extremities: Normal range of motion.  Edema: None  Mental Status: Normal mood and affect. Normal behavior. Normal judgment and thought content.   Assessment and Plan:  Pregnancy: G2P1001 at [redacted]w[redacted]d 1. Anemia during pregnancy in third trimester (Primary) Pt amenable to IV iron. Orders placed    Latest Ref Rng & Units 04/13/2023   10:02 AM 11/30/2022   11:51 AM 04/18/2021    4:17 PM  CBC  WBC 3.4 - 10.8 x10E3/uL 6.8  4.2  16.6   Hemoglobin 11.1 - 15.9 g/dL 9.5  88.9  9.7   Hematocrit 34.0 - 46.6 % 29.4  33.8  29.5   Platelets 150 - 450 x10E3/uL 272  205   228    2. History of gestational hypertension Continue on low dose asa  3. Previous baby with fetal growth restriction Follow surveillance scans 12/17: efw 68%, ac 54%, afi wnl  4. Diarrhea in adult patient Likely norovirus. Imodium  sent in and hydration instructions  Preterm labor symptoms and general obstetric precautions including but not limited to vaginal bleeding, contractions, leaking of fluid and fetal movement were reviewed in detail with the patient. Please refer to After Visit Summary for other counseling recommendations.   No follow-ups on file.  Future Appointments  Date Time Provider Department Center  06/08/2023  9:30 AM [facility] US3 [facility]    Bebe Furry, MD

## 2023-05-04 ENCOUNTER — Other Ambulatory Visit: Payer: Self-pay

## 2023-05-11 ENCOUNTER — Encounter: Payer: Medicaid Other | Admitting: Certified Nurse Midwife

## 2023-05-17 ENCOUNTER — Encounter: Payer: Medicaid Other | Admitting: Obstetrics and Gynecology

## 2023-05-18 ENCOUNTER — Ambulatory Visit: Payer: Medicaid Other | Admitting: Certified Nurse Midwife

## 2023-05-18 ENCOUNTER — Encounter: Payer: Self-pay | Admitting: Obstetrics and Gynecology

## 2023-05-18 ENCOUNTER — Other Ambulatory Visit: Payer: Self-pay

## 2023-05-18 VITALS — BP 128/67 | HR 78 | Wt 177.2 lb

## 2023-05-18 DIAGNOSIS — O99013 Anemia complicating pregnancy, third trimester: Secondary | ICD-10-CM

## 2023-05-18 DIAGNOSIS — Z3A33 33 weeks gestation of pregnancy: Secondary | ICD-10-CM | POA: Diagnosis not present

## 2023-05-18 DIAGNOSIS — Z8759 Personal history of other complications of pregnancy, childbirth and the puerperium: Secondary | ICD-10-CM

## 2023-05-18 DIAGNOSIS — Z148 Genetic carrier of other disease: Secondary | ICD-10-CM

## 2023-05-18 DIAGNOSIS — Z3492 Encounter for supervision of normal pregnancy, unspecified, second trimester: Secondary | ICD-10-CM

## 2023-05-18 MED ORDER — ACCRUFER 30 MG PO CAPS: 1.0000 | ORAL_CAPSULE | Freq: Every day | ORAL | 1 refills | Status: DC

## 2023-05-18 NOTE — Progress Notes (Signed)
 Batten, Leita BRAVO, LPN  Izell Harari, MD Hi Dr. Izell, We have attempted to contact patient by phone and left 6 voicemails and sent 1 my chart message (which was read) with no response. We will close referral unless pt calls us  back to schedule. Just wanted to send you an FYI.  Thanks, have a great day!  Leita, lpn  [facility] [address] [place], [place] [address] [phone]

## 2023-05-18 NOTE — Progress Notes (Signed)
   PRENATAL VISIT NOTE  Subjective:  Cindy Welch is a 28 y.o. G2P1001 at [redacted]w[redacted]d being seen today for ongoing prenatal care.  She is currently monitored for the following issues for this low-risk pregnancy and has Anemia during pregnancy in third trimester; History of gestational hypertension; Supervision of low-risk pregnancy; Previous baby with fetal growth restriction; and Carrier of spinal muscular atrophy on their problem list.  Patient reports no complaints.   . Vag. Bleeding: None.  Movement: Present. Denies leaking of fluid.   The following portions of the patient's history were reviewed and updated as appropriate: allergies, current medications, past family history, past medical history, past social history, past surgical history and problem list.   Objective:   Vitals:   05/18/23 1428  BP: 128/67  Pulse: 78  Weight: 177 lb 3.2 oz (80.4 kg)    Fetal Status: Fetal Heart Rate (bpm): 145 Fundal Height: 32 cm Movement: Present     General:  Alert, oriented and cooperative. Patient is in no acute distress.  Skin: Skin is warm and dry. No rash noted.   Cardiovascular: Normal heart rate noted  Respiratory: Normal respiratory effort, no problems with respiration noted  Abdomen: Soft, gravid, appropriate for gestational age.  Pain/Pressure: Absent     Pelvic: Cervical exam deferred        Extremities: Normal range of motion.  Edema: None  Mental Status: Normal mood and affect. Normal behavior. Normal judgment and thought content.   Assessment and Plan:  Pregnancy: G2P1001 at [redacted]w[redacted]d 1. Encounter for supervision of low-risk pregnancy in second trimester (Primary) - Doing well, feeling regular and vigorous fetal movement - Continue routine care.   2. [redacted] weeks gestation of pregnancy - Anticipatory guidance regarding next appointment and swabs.   3. Anemia during pregnancy in third trimester - Patient did not go to IV iron infusion appointment. Does not desire to go or agree to  go. - Patient amenable to PO Accrufer . Prescription sent to preferred pharmacy.   4. History of gestational hypertension - BP reading WNL today.   5. Previous baby with fetal growth restriction - Appropriate fundal height.  - Has US  scheduled 06/08/23.   6. Carrier of spinal muscular atrophy - Unsure FOB - Declined AFP.   Preterm labor symptoms and general obstetric precautions including but not limited to vaginal bleeding, contractions, leaking of fluid and fetal movement were reviewed in detail with the patient. Please refer to After Visit Summary for other counseling recommendations.   Return in about 2 weeks (around 06/01/2023) for LOB with GBS.  Future Appointments  Date Time Provider Department Center  05/31/2023  3:15 PM Cindy Nidia CROME, FNP [facility]  06/08/2023  9:30 AM [facility] US3 [facility]    Cindy Welch, CNM

## 2023-05-31 ENCOUNTER — Encounter: Payer: Medicaid Other | Admitting: Obstetrics and Gynecology

## 2023-06-01 ENCOUNTER — Telehealth: Payer: Self-pay | Admitting: Family Medicine

## 2023-06-01 NOTE — Telephone Encounter (Signed)
I left a message asking the patient if she plans to attend her appointment given the bad weather. I also informed her that we would call her to reschedule if necessary.

## 2023-06-02 ENCOUNTER — Ambulatory Visit (INDEPENDENT_AMBULATORY_CARE_PROVIDER_SITE_OTHER): Payer: Medicaid Other | Admitting: Family Medicine

## 2023-06-02 ENCOUNTER — Encounter: Payer: Self-pay | Admitting: Family Medicine

## 2023-06-02 ENCOUNTER — Other Ambulatory Visit: Payer: Self-pay

## 2023-06-02 VITALS — BP 128/70 | HR 82 | Wt 183.0 lb

## 2023-06-02 DIAGNOSIS — Z8759 Personal history of other complications of pregnancy, childbirth and the puerperium: Secondary | ICD-10-CM

## 2023-06-02 DIAGNOSIS — O99013 Anemia complicating pregnancy, third trimester: Secondary | ICD-10-CM

## 2023-06-02 DIAGNOSIS — Z3493 Encounter for supervision of normal pregnancy, unspecified, third trimester: Secondary | ICD-10-CM

## 2023-06-02 DIAGNOSIS — Z3A35 35 weeks gestation of pregnancy: Secondary | ICD-10-CM

## 2023-06-02 NOTE — Progress Notes (Signed)
   PRENATAL VISIT NOTE  Subjective:  Cindy Welch is a 28 y.o. G2P1001 at [redacted]w[redacted]d being seen today for ongoing prenatal care.  She is currently monitored for the following issues for this low-risk pregnancy and has Anemia during pregnancy in third trimester; History of gestational hypertension; Supervision of low-risk pregnancy; Previous baby with fetal growth restriction; and Carrier of spinal muscular atrophy on their problem list.  Patient reports no complaints.  Contractions: Not present. Vag. Bleeding: None.  Movement: Present. Denies leaking of fluid.   The following portions of the patient's history were reviewed and updated as appropriate: allergies, current medications, past family history, past medical history, past social history, past surgical history and problem list.   Objective:   Vitals:   06/02/23 0841  BP: 128/70  Pulse: 82  Weight: 183 lb (83 kg)    Fetal Status: Fetal Heart Rate (bpm): 145 Fundal Height: 35 cm Movement: Present  Presentation: Vertex  General:  Alert, oriented and cooperative. Patient is in no acute distress.  Skin: Skin is warm and dry. No rash noted.   Cardiovascular: Normal heart rate noted  Respiratory: Normal respiratory effort, no problems with respiration noted  Abdomen: Soft, gravid, appropriate for gestational age.  Pain/Pressure: Absent     Pelvic: Cervical exam deferred        Extremities: Normal range of motion.  Edema: None  Mental Status: Normal mood and affect. Normal behavior. Normal judgment and thought content.   Assessment and Plan:  Pregnancy: G2P1001 at [redacted]w[redacted]d 1. Anemia during pregnancy in third trimester (Primary) Lab Results  Component Value Date   HGB 9.5 (L) 04/13/2023   HGB 11.0 (L) 11/30/2022  Noted that MCV was elevated, concern for B12/folate deficiency.  Declined IV iron Taking Oral Fe Anemia B panel ordered  2. Encounter for supervision of low-risk pregnancy in third trimester Up to date FH is  appropriate Vigorous movement Questions today Swabs at next visit discussed  3. History of gestational hypertension BP wnl  4. Previous baby with fetal growth restriction Growth [place] scheduled on 2/25  Preterm labor symptoms and general obstetric precautions including but not limited to vaginal bleeding, contractions, leaking of fluid and fetal movement were reviewed in detail with the patient. Please refer to After Visit Summary for other counseling recommendations.   Return in about 1 week (around 06/09/2023) for Routine prenatal care.  Future Appointments  Date Time Provider Department Center  06/08/2023  9:30 AM [facility] US[facility]    Federico Flake, MD

## 2023-06-03 LAB — ANEMIA PROFILE B
Basophils Absolute: 0 10*3/uL (ref 0.0–0.2)
Basos: 0 %
EOS (ABSOLUTE): 0.1 10*3/uL (ref 0.0–0.4)
Eos: 1 %
Ferritin: 16 ng/mL (ref 15–150)
Folate: 4.9 ng/mL (ref >3.0)
Hematocrit: 28.8 % — ABNORMAL LOW (ref 34.0–46.6)
Hemoglobin: 9.4 g/dL — ABNORMAL LOW (ref 11.1–15.9)
Immature Grans (Abs): 0 10*3/uL (ref 0.0–0.1)
Immature Granulocytes: 0 %
Iron Saturation: 27 % (ref 15–55)
Iron: 106 ug/dL (ref 27–159)
Lymphocytes Absolute: 1.9 10*3/uL (ref 0.7–3.1)
Lymphs: 25 %
MCH: 33.7 pg — ABNORMAL HIGH (ref 26.6–33.0)
MCHC: 32.6 g/dL (ref 31.5–35.7)
MCV: 103 fL — ABNORMAL HIGH (ref 79–97)
Monocytes Absolute: 0.3 10*3/uL (ref 0.1–0.9)
Monocytes: 4 %
Neutrophils Absolute: 5.3 10*3/uL (ref 1.4–7.0)
Neutrophils: 70 %
Platelets: 267 10*3/uL (ref 150–450)
RBC: 2.79 x10E6/uL — ABNORMAL LOW (ref 3.77–5.28)
RDW: 10.9 % — ABNORMAL LOW (ref 11.7–15.4)
Retic Ct Pct: 2.6 % (ref 0.6–2.6)
Total Iron Binding Capacity: 389 ug/dL (ref 250–450)
UIBC: 283 ug/dL (ref 131–425)
Vitamin B-12: 350 pg/mL (ref 232–1245)
WBC: 7.6 10*3/uL (ref 3.4–10.8)

## 2023-06-07 ENCOUNTER — Encounter: Payer: Self-pay | Admitting: Family Medicine

## 2023-06-08 ENCOUNTER — Ambulatory Visit: Payer: Medicaid Other | Attending: Maternal & Fetal Medicine

## 2023-06-08 VITALS — BP 133/81

## 2023-06-08 DIAGNOSIS — O285 Abnormal chromosomal and genetic finding on antenatal screening of mother: Secondary | ICD-10-CM

## 2023-06-08 DIAGNOSIS — O365930 Maternal care for other known or suspected poor fetal growth, third trimester, not applicable or unspecified: Secondary | ICD-10-CM

## 2023-06-08 DIAGNOSIS — Z87898 Personal history of other specified conditions: Secondary | ICD-10-CM | POA: Diagnosis present

## 2023-06-08 DIAGNOSIS — O09293 Supervision of pregnancy with other poor reproductive or obstetric history, third trimester: Secondary | ICD-10-CM | POA: Diagnosis not present

## 2023-06-08 DIAGNOSIS — O99513 Diseases of the respiratory system complicating pregnancy, third trimester: Secondary | ICD-10-CM | POA: Diagnosis not present

## 2023-06-08 DIAGNOSIS — Z8759 Personal history of other complications of pregnancy, childbirth and the puerperium: Secondary | ICD-10-CM | POA: Insufficient documentation

## 2023-06-08 DIAGNOSIS — Z148 Genetic carrier of other disease: Secondary | ICD-10-CM | POA: Diagnosis not present

## 2023-06-08 DIAGNOSIS — Z3493 Encounter for supervision of normal pregnancy, unspecified, third trimester: Secondary | ICD-10-CM | POA: Insufficient documentation

## 2023-06-08 DIAGNOSIS — Z3A36 36 weeks gestation of pregnancy: Secondary | ICD-10-CM | POA: Diagnosis not present

## 2023-06-08 DIAGNOSIS — J45909 Unspecified asthma, uncomplicated: Secondary | ICD-10-CM

## 2023-06-10 ENCOUNTER — Other Ambulatory Visit (HOSPITAL_COMMUNITY)
Admission: RE | Admit: 2023-06-10 | Discharge: 2023-06-10 | Disposition: A | Discharge status: Home / Self Care | Payer: Medicaid Other | Source: Ambulatory Visit | Attending: Obstetrics and Gynecology | Admitting: Obstetrics and Gynecology

## 2023-06-10 ENCOUNTER — Ambulatory Visit: Payer: Medicaid Other | Admitting: Obstetrics and Gynecology

## 2023-06-10 VITALS — BP 135/91 | HR 65 | Wt 185.3 lb

## 2023-06-10 DIAGNOSIS — Z3493 Encounter for supervision of normal pregnancy, unspecified, third trimester: Secondary | ICD-10-CM | POA: Insufficient documentation

## 2023-06-10 DIAGNOSIS — Z148 Genetic carrier of other disease: Secondary | ICD-10-CM

## 2023-06-10 DIAGNOSIS — O99013 Anemia complicating pregnancy, third trimester: Secondary | ICD-10-CM

## 2023-06-10 DIAGNOSIS — Z8759 Personal history of other complications of pregnancy, childbirth and the puerperium: Secondary | ICD-10-CM

## 2023-06-10 DIAGNOSIS — Z3A36 36 weeks gestation of pregnancy: Secondary | ICD-10-CM

## 2023-06-10 NOTE — Progress Notes (Signed)
   PRENATAL VISIT NOTE  Subjective:  Cindy Welch is a 28 y.o. G2P1001 at [redacted]w[redacted]d being seen today for ongoing prenatal care.  She is currently monitored for the following issues for this low-risk pregnancy and has Anemia during pregnancy in third trimester; History of gestational hypertension; Supervision of low-risk pregnancy; Previous baby with fetal growth restriction; and Carrier of spinal muscular atrophy on their problem list.  Patient doing well with no acute concerns today. She reports no complaints.  Contractions: Not present. Vag. Bleeding: None.  Movement: Present. Denies leaking of fluid.   The following portions of the patient's history were reviewed and updated as appropriate: allergies, current medications, past family history, past medical history, past social history, past surgical history and problem list. Problem list updated.  Objective:   Vitals:   06/10/23 1005 06/10/23 1041  BP: (!) 136/94 (!) 135/91  Pulse: 67 65  Weight: 185 lb 4.8 oz (84.1 kg)     Fetal Status: Fetal Heart Rate (bpm): 134 Fundal Height: 37 cm Movement: Present     General:  Alert, oriented and cooperative. Patient is in no acute distress.  Skin: Skin is warm and dry. No rash noted.   Cardiovascular: Normal heart rate noted  Respiratory: Normal respiratory effort, no problems with respiration noted  Abdomen: Soft, gravid, appropriate for gestational age.  Pain/Pressure: Absent     Pelvic: Cervical exam deferred Dilation: 1.5 Effacement (%): 50 Station: Ballotable  Extremities: Normal range of motion.  Edema: None  Mental Status:  Normal mood and affect. Normal behavior. Normal judgment and thought content.   Assessment and Plan:  Pregnancy: G2P1001 at [redacted]w[redacted]d  1. Encounter for supervision of low-risk pregnancy in third trimester (Primary) Continue routine prenatal care - GC/Chlamydia probe amp ([place])not at [facility] - Culture, beta strep (group b only)  2. [redacted] weeks gestation of  pregnancy   3. Anemia during pregnancy in third trimester   4. Carrier of spinal muscular atrophy   5. History of gestational hypertension DBP elevated x 2, no other symptoms, preeclampsia precautions given.  Pt made aware if BP elevated at 37 weeks, IOL may be initiated.  6. Previous baby with fetal growth restriction EFW 30%  Preterm labor symptoms and general obstetric precautions including but not limited to vaginal bleeding, contractions, leaking of fluid and fetal movement were reviewed in detail with the patient.  Please refer to After Visit Summary for other counseling recommendations.   Return in about 1 week (around 06/17/2023) for ROB, in person.   Mariel Aloe, MD Faculty Attending Center for [facility]

## 2023-06-11 LAB — GC/CHLAMYDIA PROBE AMP (~~LOC~~) NOT AT ARMC
Chlamydia: NEGATIVE
Comment: NEGATIVE
Comment: NORMAL
Neisseria Gonorrhea: NEGATIVE

## 2023-06-14 LAB — CULTURE, BETA STREP (GROUP B ONLY): Strep Gp B Culture: NEGATIVE

## 2023-06-17 ENCOUNTER — Ambulatory Visit: Payer: Medicaid Other | Admitting: Obstetrics and Gynecology

## 2023-06-17 ENCOUNTER — Other Ambulatory Visit: Payer: Self-pay

## 2023-06-17 VITALS — BP 139/92 | HR 67 | Wt 187.5 lb

## 2023-06-17 DIAGNOSIS — Z8759 Personal history of other complications of pregnancy, childbirth and the puerperium: Secondary | ICD-10-CM | POA: Diagnosis not present

## 2023-06-17 DIAGNOSIS — Z148 Genetic carrier of other disease: Secondary | ICD-10-CM | POA: Diagnosis not present

## 2023-06-17 DIAGNOSIS — J4520 Mild intermittent asthma, uncomplicated: Secondary | ICD-10-CM

## 2023-06-17 DIAGNOSIS — Z3A37 37 weeks gestation of pregnancy: Secondary | ICD-10-CM | POA: Diagnosis not present

## 2023-06-17 DIAGNOSIS — O133 Gestational [pregnancy-induced] hypertension without significant proteinuria, third trimester: Secondary | ICD-10-CM | POA: Diagnosis not present

## 2023-06-17 DIAGNOSIS — Z3493 Encounter for supervision of normal pregnancy, unspecified, third trimester: Secondary | ICD-10-CM

## 2023-06-17 MED ORDER — ALBUTEROL SULFATE HFA 108 (90 BASE) MCG/ACT IN AERS: 1.0000 | INHALATION_SPRAY | Freq: Four times a day (QID) | RESPIRATORY_TRACT | 3 refills | Status: AC | PRN

## 2023-06-17 NOTE — Addendum Note (Signed)
 Addended by: Jill Side on: 06/17/2023 11:57 AM   Modules accepted: Orders

## 2023-06-17 NOTE — Progress Notes (Signed)
   PRENATAL VISIT NOTE  Subjective:  Cindy Welch is a 28 y.o. G2P1001 at [redacted]w[redacted]d being seen today for ongoing prenatal care.  She is currently monitored for the following issues for this high-risk pregnancy and has Anemia during pregnancy in third trimester; History of gestational hypertension; Supervision of low-risk pregnancy; Previous baby with fetal growth restriction; and Carrier of spinal muscular atrophy on their problem list.  Patient doing well with no acute concerns today. She reports no complaints.  Contractions: Irritability. Vag. Bleeding: None.  Movement: Present. Denies leaking of fluid.  Pt denies headache, visual changes and RUQ pain  The following portions of the patient's history were reviewed and updated as appropriate: allergies, current medications, past family history, past medical history, past social history, past surgical history and problem list. Problem list updated.  Objective:   Vitals:   06/17/23 1040 06/17/23 1051  BP: (!) 136/93 (!) 139/92  Pulse: 63 67  Weight: 187 lb 8 oz (85 kg)     Fetal Status: Fetal Heart Rate (bpm): 131 Fundal Height: 37 cm Movement: Present     General:  Alert, oriented and cooperative. Patient is in no acute distress.  Skin: Skin is warm and dry. No rash noted.   Cardiovascular: Normal heart rate noted  Respiratory: Normal respiratory effort, no problems with respiration noted  Abdomen: Soft, gravid, appropriate for gestational age.  Pain/Pressure: Present     Pelvic: Cervical exam deferred        Extremities: Normal range of motion.  Edema: Trace  Mental Status:  Normal mood and affect. Normal behavior. Normal judgment and thought content.   Assessment and Plan:  Pregnancy: G2P1001 at [redacted]w[redacted]d  1. [redacted] weeks gestation of pregnancy (Primary)   2. Carrier of spinal muscular atrophy   3. History of gestational hypertension   4. Previous baby with fetal growth restriction Last fetal growth scan was normal  5.  Encounter for supervision of low-risk pregnancy in third trimester Pt placed on schedule for IOL due to gestational hypertension after 37 weeks  6. Gestational hypertension, third trimester Discussed pt with OB team.  Pt placed on IOL last but there are other medical inductions ahead of her.  Pt is asymptomatic.  Will check labs and get NST before patient can return home.  She has been informed that she is on the induction list, and will be called when an induction slot opens up.  Pt given preeclampsia precautions and advised to get evaluation at [facility] if symptoms change  - CBC - Comprehensive metabolic panel - Protein / creatinine ratio, urine  7. Mild intermittent asthma without complication  - albuterol (VENTOLIN HFA) 108 (90 Base) MCG/ACT inhaler; Inhale 1-2 puffs into the lungs every 6 (six) hours as needed for wheezing or shortness of breath.  Dispense: 18 g; Refill: 3  Term labor symptoms and general obstetric precautions including but not limited to vaginal bleeding, contractions, leaking of fluid and fetal movement were reviewed in detail with the patient.  Please refer to After Visit Summary for other counseling recommendations.   Return in about 1 week (around 06/24/2023) for ROB, in person. If undelivered.  Mariel Aloe, MD Faculty Attending Center for [facility]

## 2023-06-18 ENCOUNTER — Inpatient Hospital Stay (HOSPITAL_COMMUNITY)

## 2023-06-18 LAB — COMPREHENSIVE METABOLIC PANEL
ALT: 4 IU/L (ref 0–32)
AST: 8 IU/L (ref 0–40)
Albumin: 3.5 g/dL — ABNORMAL LOW (ref 4.0–5.0)
Alkaline Phosphatase: 160 IU/L — ABNORMAL HIGH (ref 44–121)
BUN/Creatinine Ratio: 8 — ABNORMAL LOW (ref 9–23)
BUN: 4 mg/dL — ABNORMAL LOW (ref 6–20)
Bilirubin Total: 0.3 mg/dL (ref 0.0–1.2)
CO2: 20 mmol/L (ref 20–29)
Calcium: 8.6 mg/dL — ABNORMAL LOW (ref 8.7–10.2)
Chloride: 104 mmol/L (ref 96–106)
Creatinine, Ser: 0.53 mg/dL — ABNORMAL LOW (ref 0.57–1.00)
Globulin, Total: 3.1 g/dL (ref 1.5–4.5)
Glucose: 74 mg/dL (ref 70–99)
Potassium: 3.6 mmol/L (ref 3.5–5.2)
Sodium: 139 mmol/L (ref 134–144)
Total Protein: 6.6 g/dL (ref 6.0–8.5)
eGFR: 132 mL/min/{1.73_m2} (ref >59)

## 2023-06-18 LAB — PROTEIN / CREATININE RATIO, URINE
Creatinine, Urine: 44 mg/dL
Protein, Ur: 5.6 mg/dL
Protein/Creat Ratio: 127 mg/g{creat} (ref 0–200)

## 2023-06-18 LAB — CBC
Hematocrit: 30.1 % — ABNORMAL LOW (ref 34.0–46.6)
Hemoglobin: 9.8 g/dL — ABNORMAL LOW (ref 11.1–15.9)
MCH: 33.4 pg — ABNORMAL HIGH (ref 26.6–33.0)
MCHC: 32.6 g/dL (ref 31.5–35.7)
MCV: 103 fL — ABNORMAL HIGH (ref 79–97)
Platelets: 260 10*3/uL (ref 150–450)
RBC: 2.93 x10E6/uL — ABNORMAL LOW (ref 3.77–5.28)
RDW: 10.9 % — ABNORMAL LOW (ref 11.7–15.4)
WBC: 7.4 10*3/uL (ref 3.4–10.8)

## 2023-06-19 ENCOUNTER — Inpatient Hospital Stay (HOSPITAL_COMMUNITY): Admitting: Anesthesiology

## 2023-06-19 ENCOUNTER — Encounter (HOSPITAL_COMMUNITY): Payer: Self-pay | Admitting: Obstetrics and Gynecology

## 2023-06-19 ENCOUNTER — Inpatient Hospital Stay (HOSPITAL_COMMUNITY)
Admission: RE | Admit: 2023-06-19 | Discharge: 2023-06-20 | DRG: 807 | Disposition: A | Discharge status: Home / Self Care | Attending: Obstetrics & Gynecology | Admitting: Obstetrics & Gynecology

## 2023-06-19 ENCOUNTER — Other Ambulatory Visit: Payer: Self-pay

## 2023-06-19 DIAGNOSIS — Z7982 Long term (current) use of aspirin: Secondary | ICD-10-CM | POA: Diagnosis not present

## 2023-06-19 DIAGNOSIS — O9952 Diseases of the respiratory system complicating childbirth: Secondary | ICD-10-CM | POA: Diagnosis present

## 2023-06-19 DIAGNOSIS — Z3A38 38 weeks gestation of pregnancy: Secondary | ICD-10-CM

## 2023-06-19 DIAGNOSIS — Z8249 Family history of ischemic heart disease and other diseases of the circulatory system: Secondary | ICD-10-CM | POA: Diagnosis not present

## 2023-06-19 DIAGNOSIS — O99013 Anemia complicating pregnancy, third trimester: Secondary | ICD-10-CM | POA: Diagnosis present

## 2023-06-19 DIAGNOSIS — O139 Gestational [pregnancy-induced] hypertension without significant proteinuria, unspecified trimester: Principal | ICD-10-CM | POA: Diagnosis present

## 2023-06-19 DIAGNOSIS — J45909 Unspecified asthma, uncomplicated: Secondary | ICD-10-CM | POA: Diagnosis present

## 2023-06-19 DIAGNOSIS — O134 Gestational [pregnancy-induced] hypertension without significant proteinuria, complicating childbirth: Principal | ICD-10-CM | POA: Diagnosis present

## 2023-06-19 DIAGNOSIS — O9902 Anemia complicating childbirth: Secondary | ICD-10-CM | POA: Diagnosis present

## 2023-06-19 DIAGNOSIS — Z148 Genetic carrier of other disease: Secondary | ICD-10-CM

## 2023-06-19 DIAGNOSIS — Z8759 Personal history of other complications of pregnancy, childbirth and the puerperium: Secondary | ICD-10-CM

## 2023-06-19 DIAGNOSIS — Z3490 Encounter for supervision of normal pregnancy, unspecified, unspecified trimester: Secondary | ICD-10-CM

## 2023-06-19 DIAGNOSIS — O4202 Full-term premature rupture of membranes, onset of labor within 24 hours of rupture: Secondary | ICD-10-CM | POA: Diagnosis not present

## 2023-06-19 LAB — CBC
HCT: 27.4 % — ABNORMAL LOW (ref 36.0–46.0)
HCT: 30.6 % — ABNORMAL LOW (ref 36.0–46.0)
Hemoglobin: 9.1 g/dL — ABNORMAL LOW (ref 12.0–15.0)
Hemoglobin: 10.1 g/dL — ABNORMAL LOW (ref 12.0–15.0)
MCH: 34.6 pg — ABNORMAL HIGH (ref 26.0–34.0)
MCH: 34 pg (ref 26.0–34.0)
MCHC: 33.2 g/dL (ref 30.0–36.0)
MCHC: 33 g/dL (ref 30.0–36.0)
MCV: 104.2 fL — ABNORMAL HIGH (ref 80.0–100.0)
MCV: 103 fL — ABNORMAL HIGH (ref 80.0–100.0)
Platelets: 244 10*3/uL (ref 150–400)
Platelets: 245 10*3/uL (ref 150–400)
RBC: 2.63 MIL/uL — ABNORMAL LOW (ref 3.87–5.11)
RBC: 2.97 MIL/uL — ABNORMAL LOW (ref 3.87–5.11)
RDW: 12.7 % (ref 11.5–15.5)
RDW: 12.6 % (ref 11.5–15.5)
WBC: 7.3 10*3/uL (ref 4.0–10.5)
WBC: 9.9 10*3/uL (ref 4.0–10.5)
nRBC: 0 % (ref 0.0–0.2)
nRBC: 0 % (ref 0.0–0.2)

## 2023-06-19 LAB — TYPE AND SCREEN
ABO/RH(D): B POS
Antibody Screen: NEGATIVE

## 2023-06-19 LAB — COMPREHENSIVE METABOLIC PANEL
ALT: 8 U/L (ref 0–44)
AST: 11 U/L — ABNORMAL LOW (ref 15–41)
Albumin: 2.6 g/dL — ABNORMAL LOW (ref 3.5–5.0)
Alkaline Phosphatase: 119 U/L (ref 38–126)
Anion gap: 8 (ref 5–15)
BUN: 6 mg/dL (ref 6–20)
CO2: 21 mmol/L — ABNORMAL LOW (ref 22–32)
Calcium: 8.8 mg/dL — ABNORMAL LOW (ref 8.9–10.3)
Chloride: 105 mmol/L (ref 98–111)
Creatinine, Ser: 0.46 mg/dL (ref 0.44–1.00)
GFR, Estimated: >60 mL/min (ref >60)
Glucose, Bld: 83 mg/dL (ref 70–99)
Potassium: 3.2 mmol/L — ABNORMAL LOW (ref 3.5–5.1)
Sodium: 134 mmol/L — ABNORMAL LOW (ref 135–145)
Total Bilirubin: 0.5 mg/dL (ref 0.0–1.2)
Total Protein: 6 g/dL — ABNORMAL LOW (ref 6.5–8.1)

## 2023-06-19 LAB — PROTEIN / CREATININE RATIO, URINE
Creatinine, Urine: 122 mg/dL
Protein Creatinine Ratio: 0.12 mg/mg{creat} (ref 0.00–0.15)
Total Protein, Urine: 15 mg/dL

## 2023-06-19 LAB — RPR: RPR Ser Ql: NONREACTIVE

## 2023-06-19 MED ORDER — PRENATAL MULTIVITAMIN CH
1.0000 | ORAL_TABLET | Freq: Every day | ORAL | Status: DC
  Administered 2023-06-20: 1 via ORAL
  Filled 2023-06-19: qty 1

## 2023-06-19 MED ORDER — LACTATED RINGERS IV SOLN: 500.0000 mL | INTRAVENOUS | Status: DC | PRN

## 2023-06-19 MED ORDER — MISOPROSTOL 50MCG HALF TABLET
50.0000 ug | ORAL_TABLET | ORAL | Status: DC | PRN
  Administered 2023-06-19: 50 ug via BUCCAL
  Filled 2023-06-19: qty 1

## 2023-06-19 MED ORDER — POTASSIUM CHLORIDE 20 MEQ PO PACK
20.0000 meq | PACK | Freq: Every day | ORAL | Status: DC
  Filled 2023-06-19: qty 1

## 2023-06-19 MED ORDER — SIMETHICONE 80 MG PO CHEW: 80.0000 mg | CHEWABLE_TABLET | ORAL | Status: DC | PRN

## 2023-06-19 MED ORDER — SENNOSIDES-DOCUSATE SODIUM 8.6-50 MG PO TABS
2.0000 | ORAL_TABLET | ORAL | Status: DC
  Filled 2023-06-19 (×3): qty 2

## 2023-06-19 MED ORDER — POTASSIUM CHLORIDE CRYS ER 20 MEQ PO TBCR
20.0000 meq | EXTENDED_RELEASE_TABLET | Freq: Every day | ORAL | Status: DC
  Administered 2023-06-19 – 2023-06-20 (×2): 20 meq via ORAL
  Filled 2023-06-19 (×2): qty 1

## 2023-06-19 MED ORDER — DIPHENHYDRAMINE HCL 25 MG PO CAPS: 25.0000 mg | ORAL_CAPSULE | Freq: Four times a day (QID) | ORAL | Status: DC | PRN

## 2023-06-19 MED ORDER — WITCH HAZEL-GLYCERIN EX PADS: 1.0000 | MEDICATED_PAD | CUTANEOUS | Status: DC | PRN

## 2023-06-19 MED ORDER — ACETAMINOPHEN 325 MG PO TABS: 650.0000 mg | ORAL_TABLET | ORAL | Status: DC | PRN

## 2023-06-19 MED ORDER — FENTANYL CITRATE (PF) 100 MCG/2ML IJ SOLN
50.0000 ug | INTRAMUSCULAR | Status: DC | PRN
Start: 2023-06-19 — End: 2023-06-19
  Administered 2023-06-19 (×3): 100 ug via INTRAVENOUS
  Filled 2023-06-19 (×3): qty 2

## 2023-06-19 MED ORDER — OXYCODONE-ACETAMINOPHEN 5-325 MG PO TABS: 2.0000 | ORAL_TABLET | ORAL | Status: DC | PRN

## 2023-06-19 MED ORDER — FUROSEMIDE 20 MG PO TABS
20.0000 mg | ORAL_TABLET | Freq: Two times a day (BID) | ORAL | Status: DC
  Administered 2023-06-19 – 2023-06-20 (×3): 20 mg via ORAL
  Filled 2023-06-19 (×3): qty 1

## 2023-06-19 MED ORDER — BENZOCAINE-MENTHOL 20-0.5 % EX AERO
1.0000 | INHALATION_SPRAY | CUTANEOUS | Status: DC | PRN
  Administered 2023-06-19: 1 via TOPICAL
  Filled 2023-06-19: qty 56

## 2023-06-19 MED ORDER — HYDROXYZINE HCL 50 MG PO TABS
50.0000 mg | ORAL_TABLET | Freq: Four times a day (QID) | ORAL | Status: DC | PRN
  Administered 2023-06-19: 50 mg via ORAL
  Filled 2023-06-19: qty 1

## 2023-06-19 MED ORDER — LACTATED RINGERS IV SOLN: INTRAVENOUS | Status: DC

## 2023-06-19 MED ORDER — TERBUTALINE SULFATE 1 MG/ML IJ SOLN: 0.2500 mg | Freq: Once | INTRAMUSCULAR | Status: DC | PRN

## 2023-06-19 MED ORDER — IBUPROFEN 600 MG PO TABS
600.0000 mg | ORAL_TABLET | Freq: Four times a day (QID) | ORAL | Status: DC
  Administered 2023-06-19 – 2023-06-20 (×4): 600 mg via ORAL
  Filled 2023-06-19 (×5): qty 1

## 2023-06-19 MED ORDER — OXYCODONE HCL 5 MG PO TABS: 5.0000 mg | ORAL_TABLET | ORAL | Status: DC | PRN

## 2023-06-19 MED ORDER — FENTANYL-BUPIVACAINE-NACL 0.5-0.125-0.9 MG/250ML-% EP SOLN
12.0000 mL/h | EPIDURAL | Status: DC | PRN
  Administered 2023-06-19: 12 mL/h via EPIDURAL
  Filled 2023-06-19: qty 250

## 2023-06-19 MED ORDER — SODIUM CHLORIDE 0.9 % IV SOLN: 250.0000 mL | INTRAVENOUS | Status: DC | PRN

## 2023-06-19 MED ORDER — PHENYLEPHRINE 80 MCG/ML (10ML) SYRINGE FOR IV PUSH (FOR BLOOD PRESSURE SUPPORT): 80.0000 ug | PREFILLED_SYRINGE | INTRAVENOUS | Status: DC | PRN

## 2023-06-19 MED ORDER — OXYCODONE-ACETAMINOPHEN 5-325 MG PO TABS: 1.0000 | ORAL_TABLET | ORAL | Status: DC | PRN

## 2023-06-19 MED ORDER — LACTATED RINGERS IV SOLN
500.0000 mL | Freq: Once | INTRAVENOUS | Status: DC
Start: 2023-06-19 — End: 2023-06-19

## 2023-06-19 MED ORDER — OXYTOCIN BOLUS FROM INFUSION
333.0000 mL | Freq: Once | INTRAVENOUS | Status: AC
  Administered 2023-06-19: 333 mL via INTRAVENOUS

## 2023-06-19 MED ORDER — SODIUM CHLORIDE 0.9% FLUSH: 3.0000 mL | INTRAVENOUS | Status: DC | PRN

## 2023-06-19 MED ORDER — ONDANSETRON HCL 4 MG/2ML IJ SOLN: 4.0000 mg | Freq: Four times a day (QID) | INTRAMUSCULAR | Status: DC | PRN

## 2023-06-19 MED ORDER — MISOPROSTOL 50MCG HALF TABLET: 50.0000 ug | ORAL_TABLET | ORAL | Status: DC

## 2023-06-19 MED ORDER — DIBUCAINE (PERIANAL) 1 % EX OINT: 1.0000 | TOPICAL_OINTMENT | CUTANEOUS | Status: DC | PRN

## 2023-06-19 MED ORDER — OXYTOCIN-SODIUM CHLORIDE 30-0.9 UT/500ML-% IV SOLN
1.0000 m[IU]/min | INTRAVENOUS | Status: DC
  Administered 2023-06-19: 2 m[IU]/min via INTRAVENOUS
  Filled 2023-06-19: qty 500

## 2023-06-19 MED ORDER — LIDOCAINE HCL (PF) 1 % IJ SOLN
INTRAMUSCULAR | Status: DC | PRN
  Administered 2023-06-19 (×2): 4 mL via EPIDURAL

## 2023-06-19 MED ORDER — ZOLPIDEM TARTRATE 5 MG PO TABS: 5.0000 mg | ORAL_TABLET | Freq: Every evening | ORAL | Status: DC | PRN

## 2023-06-19 MED ORDER — COCONUT OIL OIL: 1.0000 | TOPICAL_OIL | Status: DC | PRN

## 2023-06-19 MED ORDER — SOD CITRATE-CITRIC ACID 500-334 MG/5ML PO SOLN: 30.0000 mL | ORAL | Status: DC | PRN

## 2023-06-19 MED ORDER — POTASSIUM CHLORIDE CRYS ER 20 MEQ PO TBCR
40.0000 meq | EXTENDED_RELEASE_TABLET | Freq: Once | ORAL | Status: AC
  Administered 2023-06-19: 40 meq via ORAL
  Filled 2023-06-19: qty 2

## 2023-06-19 MED ORDER — DIPHENHYDRAMINE HCL 50 MG/ML IJ SOLN: 12.5000 mg | INTRAMUSCULAR | Status: DC | PRN

## 2023-06-19 MED ORDER — NIFEDIPINE ER OSMOTIC RELEASE 30 MG PO TB24
30.0000 mg | ORAL_TABLET | Freq: Every day | ORAL | Status: DC
  Administered 2023-06-19: 30 mg via ORAL
  Filled 2023-06-19: qty 1

## 2023-06-19 MED ORDER — EPHEDRINE 5 MG/ML INJ: 10.0000 mg | INTRAVENOUS | Status: DC | PRN

## 2023-06-19 MED ORDER — OXYTOCIN-SODIUM CHLORIDE 30-0.9 UT/500ML-% IV SOLN: 2.5000 [IU]/h | INTRAVENOUS | Status: DC

## 2023-06-19 MED ORDER — MEASLES, MUMPS & RUBELLA VAC IJ SOLR: 0.5000 mL | Freq: Once | INTRAMUSCULAR | Status: DC

## 2023-06-19 MED ORDER — ONDANSETRON HCL 4 MG PO TABS: 4.0000 mg | ORAL_TABLET | ORAL | Status: DC | PRN

## 2023-06-19 MED ORDER — TETANUS-DIPHTH-ACELL PERTUSSIS 5-2.5-18.5 LF-MCG/0.5 IM SUSY: 0.5000 mL | PREFILLED_SYRINGE | Freq: Once | INTRAMUSCULAR | Status: DC

## 2023-06-19 MED ORDER — LIDOCAINE HCL (PF) 1 % IJ SOLN: 30.0000 mL | INTRAMUSCULAR | Status: DC | PRN

## 2023-06-19 MED ORDER — ONDANSETRON HCL 4 MG/2ML IJ SOLN: 4.0000 mg | INTRAMUSCULAR | Status: DC | PRN

## 2023-06-19 MED ORDER — SODIUM CHLORIDE 0.9% FLUSH: 3.0000 mL | Freq: Two times a day (BID) | INTRAVENOUS | Status: DC

## 2023-06-19 NOTE — Anesthesia Preprocedure Evaluation (Signed)
 Anesthesia Evaluation  Patient identified by MRN, date of birth, ID band Patient awake    Reviewed: Allergy & Precautions, Patient's Chart, lab work & pertinent test results  History of Anesthesia Complications Negative for: history of anesthetic complications  Airway Mallampati: II  TM Distance: >3 FB Neck ROM: Full    Dental no notable dental hx.    Pulmonary asthma    Pulmonary exam normal        Cardiovascular hypertension, Normal cardiovascular exam     Neuro/Psych negative neurological ROS  negative psych ROS   GI/Hepatic negative GI ROS, Neg liver ROS,,,  Endo/Other  negative endocrine ROS    Renal/GU negative Renal ROS  negative genitourinary   Musculoskeletal negative musculoskeletal ROS (+)    Abdominal   Peds  Hematology  (+) Blood dyscrasia, anemia   Anesthesia Other Findings Day of surgery medications reviewed with patient.  Reproductive/Obstetrics (+) Pregnancy (preE)                             Anesthesia Physical Anesthesia Plan  ASA: 2  Anesthesia Plan: Epidural   Post-op Pain Management:    Induction:   PONV Risk Score and Plan: Treatment may vary due to age or medical condition  Airway Management Planned: Natural Airway  Additional Equipment: Fetal Monitoring  Intra-op Plan:   Post-operative Plan:   Informed Consent: I have reviewed the patients History and Physical, chart, labs and discussed the procedure including the risks, benefits and alternatives for the proposed anesthesia with the patient or authorized representative who has indicated his/her understanding and acceptance.       Plan Discussed with:   Anesthesia Plan Comments:        Anesthesia Quick Evaluation

## 2023-06-19 NOTE — Progress Notes (Signed)
 First push, good movement, small crown noted when pushing, MD at [facility]

## 2023-06-19 NOTE — Plan of Care (Signed)

## 2023-06-19 NOTE — Anesthesia Procedure Notes (Signed)
 Epidural Patient location during procedure: OB Start time: 06/19/2023 2:09 PM End time: 06/19/2023 2:12 PM  Staffing Anesthesiologist: Kaylyn Layer, MD Performed: anesthesiologist   Preanesthetic Checklist Completed: patient identified, IV checked, risks and benefits discussed, monitors and equipment checked, pre-op evaluation and timeout performed  Epidural Patient position: sitting Prep: DuraPrep and site prepped and draped Patient monitoring: continuous pulse ox, blood pressure and heart rate Approach: midline Location: L3-L4 Injection technique: LOR air  Needle:  Needle type: Tuohy  Needle gauge: 17 G Needle length: 9 cm Needle insertion depth: 5 cm Catheter type: closed end flexible Catheter size: 19 Gauge Catheter at skin depth: 10 cm Test dose: negative and Other (1% lidocaine)  Assessment Events: blood not aspirated, no cerebrospinal fluid, injection not painful, no injection resistance, no paresthesia and negative IV test  Additional Notes Patient identified. Risks, benefits, and alternatives discussed with patient including but not limited to bleeding, infection, nerve damage, paralysis, failed block, incomplete pain control, headache, blood pressure changes, nausea, vomiting, reactions to medication, itching, and postpartum back pain. Confirmed with bedside nurse the patient's most recent platelet count. Confirmed with patient that they are not currently taking any anticoagulation, have any bleeding history, or any family history of bleeding disorders. Patient expressed understanding and wished to proceed. All questions were answered. Sterile technique was used throughout the entire procedure. Please see nursing notes for vital signs.   Crisp LOR on first pass. Test dose was given through epidural catheter and negative prior to continuing to dose epidural or start infusion. Warning signs of high block given to the patient including shortness of breath, tingling/numbness  in hands, complete motor block, or any concerning symptoms with instructions to call for help. Patient was given instructions on fall risk and not to get out of bed. All questions and concerns addressed with instructions to call with any issues or inadequate analgesia.  Reason for block:procedure for pain

## 2023-06-19 NOTE — Lactation Note (Signed)
 This note was copied from a baby's chart. Lactation Consultation Note  Patient Name: Cindy Welch [id number] Date: 06/19/2023 Age:26 hours Reason for consult: Initial assessment;Early term 37-38.6wks.  LC did not assist with latch, infant given formula less than 3 hours, MOB has LBW/LPTI feeding card in basinet that was given by RN, MOB is feeding infant every 3 hours. MOB was not set up with DEBP and wants to pump, LC fitted MOB with 27 mm breast flange and demonstrated how to use DEBP, MOB was expressing colostrum in flanges when pumping. MOB goal is to BF, she had latch difficulty with breastfeeding her 1st child. Per MOB, infant BF for 20 minutes in L&D, LC suggested MOB continue to latch infant at breast first before supplement infant with EBM and formula.   MOB knows that her EBM is safe for 4 hours whereas formula once open must be used within 1 hour.   MOB current feeding plan: 1- MOB will latch infant first every feeding, every 3 hours, skin to skin. 2- Afterwards MOB will supplement infant with any pumped EBM first and then formula, Day 1 will offer ( 12-14 mls) per feeding, following the LPTI/LBW card given by RN that's in infant's basinet. 3- MOB will continue to pump every 3 hours for 15 minutes on initial setting.  4- MOB knows to call for further latch assistance if needed.  Maternal Data Has patient been taught Hand Expression?: Yes Does the patient have breastfeeding experience prior to this delivery?: Yes How long did the patient breastfeed?: MOB stopped first few days due to latch difficultes with her 1st child.  Feeding Mother's Current Feeding Choice: Breast Milk and Formula Nipple Type: Nfant Standard Flow (white)  LATCH Score ( Latch score done by RN). Latch: Grasps breast easily, tongue down, lips flanged, rhythmical sucking.  Audible Swallowing: A few with stimulation  Type of Nipple: Everted at rest and after stimulation  Comfort (Breast/Nipple): Soft /  non-tender  Hold (Positioning): Assistance needed to correctly position infant at breast and maintain latch.  LATCH Score: 8   Lactation Tools Discussed/Used Tools: Pump;Flanges Flange Size: 27 Breast pump type: Double-Electric Breast Pump Pump Education: Setup, frequency, and cleaning;Milk Storage Reason for Pumping: Infant is ETI and MOB would like to use DEBP Pumping frequency: MOB will continue to use DEBP every 3 hours for 15 minutes on inital setting. Pumped volume: 2 mL (MOB was still expressing colostrum when LC left the room.)  Interventions Interventions: Breast feeding basics reviewed;Skin to skin;Expressed milk;Hand express;DEBP;Education;Pace feeding;LC Services brochure;CDC milk storage guidelines;CDC Guidelines for Breast Pump Cleaning  Discharge Pump: Manual (MOB does not have DEBP at home has [employer] insurance.)  Consult Status Consult Status: Follow-up Date: 06/20/23 Follow-up type: In-patient    [name] 06/19/2023, 7:38 PM

## 2023-06-19 NOTE — H&P (Signed)
 LABOR AND DELIVERY ADMISSION HISTORY AND PHYSICAL NOTE  MAYSEN BONSIGNORE is a 28 y.o. female G2P1001 with IUP at [redacted]w[redacted]d presenting for IOL 2/2 gHTN.   Patient reports the fetal movement as active. Patient reports uterine contraction activity as rare. Patient reports  vaginal bleeding as none. Patient describes fluid per vagina as None.   Patient denies headache, vision changes, chest pain, shortness of breath, right upper quadrant pain, or LE edema.  She plans on bottle feeding. Her contraception plan is: no method.  Prenatal History/Complications: PNC at [facility]  Sono:  @[redacted]w[redacted]d , CWD, normal anatomy, cephalic presentation, posterior placenta, 30%ile  Pregnancy complications:  Patient Active Problem List   Diagnosis Date Noted   Gestational hypertension 06/19/2023   Asthma 06/19/2023   Carrier of spinal muscular atrophy 01/21/2023   Previous baby with fetal growth restriction 11/30/2022   Supervision of low-risk pregnancy 11/24/2022   History of gestational hypertension 04/17/2021   Anemia during pregnancy in third trimester 10/12/2020    Past Medical History: Past Medical History:  Diagnosis Date   Abnormal chromosomal and genetic finding on antenatal screening of mother 10/19/2020   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 >> genetic counseling referral     Asthma    Supervision of normal first pregnancy, antepartum 09/03/2020              Nursing Staff    Provider      Office Location     [facility]    Dating     LMP      Language     English    Anatomy US     Normal girl      Flu Vaccine     Declined    Genetic/Carrier Screen     NIPS: LR Female   AFP: Negative  Horizon: Increased SMA Risk (2 copies of SMN1)      TDaP Vaccine      02/06/2021    Hgb A1C or   GTT    Early 4.4  Third trimester Normal         Glucose, Fast    Past Surgical History: Past Surgical History:  Procedure Laterality Date   NO PAST SURGERIES      Obstetrical History: OB History     Gravida  2   Para  1   Term   1   Preterm      AB      Living  1      SAB      IAB      Ectopic      Multiple  0   Live Births  1           Social History: Social History   Socioeconomic History   Marital status: Single    Spouse name: Not on file   Number of children: Not on file   Years of education: Not on file   Highest education level: Some college, no degree  Occupational History    Employer: [employer]    Comment: not working  Tobacco Use   Smoking status: Never    Passive exposure: Yes   Smokeless tobacco: Never  Vaping Use   Vaping status: Never Used  Substance and Sexual Activity   Alcohol use: Not Currently    Comment: socially   Drug use: Not Currently    Types: Marijuana    Comment: last use nov 2024   Sexual activity: Yes    Comment: occasionally  Other Topics  Concern   Not on file  Social History Narrative   Not on file   Social Drivers of Health   Financial Resource Strain: Low Risk  (04/13/2023)   Overall Financial Resource Strain (CARDIA)    Difficulty of Paying Living Expenses: Not very hard  Food Insecurity: Food Insecurity Present (04/13/2023)   Hunger Vital Sign    Worried About Running Out of Food in the Last Year: Sometimes true    Ran Out of Food in the Last Year: Sometimes true  Transportation Needs: No Transportation Needs (04/13/2023)   PRAPARE - Administrator, Civil Service (Medical): No    Lack of Transportation (Non-Medical): No  Physical Activity: Insufficiently Active (04/13/2023)   Exercise Vital Sign    Days of Exercise per Week: 4 days    Minutes of Exercise per Session: 30 min  Stress: No Stress Concern Present (04/13/2023)   [employer] of Occupational Health - Occupational Stress Questionnaire    Feeling of Stress : Not at all  Social Connections: Moderately Integrated (04/13/2023)   Social Connection and Isolation Panel [NHANES]    Frequency of Communication with Friends and Family: More than three times a week     Frequency of Social Gatherings with Friends and Family: Twice a week    Attends Religious Services: 1 to 4 times per year    Active Member of [employer] or Organizations: No    Attends Engineer, structural: Not on file    Marital Status: Living with partner    Family History: Family History  Problem Relation Age of Onset   Hypertension Mother    Heart failure Mother    Asthma Father    Lupus Maternal Aunt    Cancer Paternal Aunt        breast   Lupus Maternal Grandmother     Allergies: No Known Allergies  Medications Prior to Admission  Medication Sig Dispense Refill Last Dose/Taking   albuterol (VENTOLIN HFA) 108 (90 Base) MCG/ACT inhaler Inhale 1-2 puffs into the lungs every 6 (six) hours as needed for wheezing or shortness of breath. 18 g 3    aspirin EC 81 MG tablet Take 1 tablet (81 mg total) by mouth daily. 90 tablet 3    Ferric Maltol (ACCRUFER) 30 MG CAPS Take 1 capsule (30 mg total) by mouth daily. 90 capsule 1    Prenatal Vit-Fe Fumarate-FA (MULTIVITAMIN-PRENATAL) 27-0.8 MG TABS tablet Take 1 tablet by mouth daily at 12 noon.        Review of Systems  All systems reviewed and negative except as stated in HPI  Physical Exam BP 134/77   Pulse 64   Temp 97.7 F (36.5 C) (Oral)   Resp 18   LMP 09/26/2022 (Exact Date)   SpO2 100%   Physical Exam Constitutional:      General: She is not in acute distress.    Appearance: She is not ill-appearing.  Cardiovascular:     Rate and Rhythm: Normal rate and regular rhythm.  Pulmonary:     Effort: Pulmonary effort is normal.  Abdominal:     Comments: Gravid  Genitourinary:    General: Normal vulva.     Rectum: Normal.  Musculoskeletal:        General: No swelling.  Skin:    General: Skin is warm and dry.  Neurological:     General: No focal deficit present.  Psychiatric:        Mood and Affect: Mood normal.  Presentation: cephalic by check  Fetal monitoring: Baseline: 130 bpm, Variability: Good {>  6 bpm), Accelerations: Reactive, and Decelerations: Absent Uterine activity: None  Dilation: 1.5 Effacement (%): 50 Station: -3 Presentation: Vertex Exam by:: Bria Torrence  Prenatal labs: ABO, Rh: --/--/B POS (03/08 0110) Antibody: NEG (03/08 0110) Rubella: 1.90 (08/19 1151) RPR: Non Reactive (12/31 1002)  HBsAg: Negative (08/19 1151)  HIV: Non Reactive (12/31 1002)  GC/Chlamydia:  Neisseria Gonorrhea  Date Value Ref Range Status  06/10/2023 Negative  Final   Chlamydia  Date Value Ref Range Status  06/10/2023 Negative  Final   GBS: Negative/-- (02/27 0227)   Prenatal Transfer Tool  Maternal Diabetes: No Genetic Screening: Normal Maternal Ultrasounds/Referrals: Normal Fetal Ultrasounds or other Referrals:  None Maternal Substance Abuse:  No Significant Maternal Medications:  albuterol Significant Maternal Lab Results: Group B Strep negative  Results for orders placed or performed during the hospital encounter of 06/19/23 (from the past 24 hours)  Type and screen [facility]   Collection Time: 06/19/23  1:10 AM  Result Value Ref Range   ABO/RH(D) B POS    Antibody Screen NEG    Sample Expiration      06/22/2023,2359 Performed at [facility], [address] [place], [place] [address]   CBC   Collection Time: 06/19/23  1:47 AM  Result Value Ref Range   WBC 7.3 4.0 - 10.5 K/uL   RBC 2.63 (L) 3.87 - 5.11 MIL/uL   Hemoglobin 9.1 (L) 12.0 - 15.0 g/dL   HCT 60.4 (L) 54.0 - 98.1 %   MCV 104.2 (H) 80.0 - 100.0 fL   MCH 34.6 (H) 26.0 - 34.0 pg   MCHC 33.2 30.0 - 36.0 g/dL   RDW 19.1 47.8 - 29.5 %   Platelets 244 150 - 400 K/uL   nRBC 0.0 0.0 - 0.2 %  Comprehensive metabolic panel   Collection Time: 06/19/23  1:55 AM  Result Value Ref Range   Sodium 134 (L) 135 - 145 mmol/L   Potassium 3.2 (L) 3.5 - 5.1 mmol/L   Chloride 105 98 - 111 mmol/L   CO2 21 (L) 22 - 32 mmol/L   Glucose, Bld 83 70 - 99 mg/dL   BUN 6 6 - 20 mg/dL   Creatinine,  Ser 6.21 0.44 - 1.00 mg/dL   Calcium 8.8 (L) 8.9 - 10.3 mg/dL   Total Protein 6.0 (L) 6.5 - 8.1 g/dL   Albumin 2.6 (L) 3.5 - 5.0 g/dL   AST 11 (L) 15 - 41 U/L   ALT 8 0 - 44 U/L   Alkaline Phosphatase 119 38 - 126 U/L   Total Bilirubin 0.5 0.0 - 1.2 mg/dL   GFR, Estimated >30 >86 mL/min   Anion gap 8 5 - 15    Assessment: SANAIA JASSO is a 28 y.o. G2P1001 at [redacted]w[redacted]d here for IOL 2/2 gHTN.  #Labor: Patient amenable to FB plus cytotec. Discussed R/B/A of FB placement, and verbal consent obtained. Placed Cook, and balloon filled with 60 cc of saline. Mom and babe tolerated well. Cytotec 50 mcg buccal additionally given. Discussed AROM +/- pit after balloon out. #Pain: IV pain meds PRN, epidural upon request #FHT: Category I #GBS/ID: Negative #MOF: bottle feeding #MOC: no method  #GHTN: Obtain CBC, CMP, P:C. No s/sx of preE at this time. Plan for Lasix + K after delivery #Anemia: s/p IV iron infusions. Admit Hgb 9.1  Joanne Gavel, MD [facility] Fellow [facility], [facility]  06/19/2023, 2:50 AM

## 2023-06-19 NOTE — Progress Notes (Signed)
 Called for elevated blood pressures Asymptomatic     06/19/2023    5:52 PM 06/19/2023    4:43 PM 06/19/2023    4:01 PM  Vitals with BMI  Systolic 154 153 578  Diastolic 96 87 83  Pulse 56 56 59   Added Lasix 20mg  daily, Procardia 30mg  XL daily and KCL (K 3.2 on admission) daily  Wyn Forster, MD FMOB Fellow, Faculty practice [facility], [facility]

## 2023-06-19 NOTE — Discharge Summary (Signed)
 Postpartum Discharge Summary  Date of Service updated***     Patient Name: Cindy Welch DOB: [birth year] MRN: [id number]  Date of admission: 06/19/2023 Delivery date:06/19/2023 Delivering provider: Adam Phenix Date of discharge: 06/19/2023  Admitting diagnosis: Gestational hypertension [O13.9] Intrauterine pregnancy: [redacted]w[redacted]d     Secondary diagnosis:  Principal Problem:   Gestational hypertension Active Problems:   Anemia during pregnancy in third trimester   NSVD (normal spontaneous vaginal delivery)   Supervision of low-risk pregnancy   Previous baby with fetal growth restriction   Carrier of spinal muscular atrophy   Asthma  Additional problems: ***    Discharge diagnosis: Term Pregnancy Delivered and Gestational Hypertension                                              Post partum procedures:{Postpartum procedures:23558} Augmentation: Pitocin, Cytotec, and IP Foley Complications: {OB Labor/Delivery Complications:20784}  Hospital course: Induction of Labor With Vaginal Delivery   28 y.o. yo [id number] at [redacted]w[redacted]d was admitted to the hospital 06/19/2023 for induction of labor.  Indication for induction: Gestational hypertension.  Patient had an labor course uncomplicated. Membrane Rupture Time/Date: 2:39 PM,06/19/2023  Delivery Method:Vaginal, Spontaneous Operative Delivery:N/A Episiotomy: None Lacerations:  1st degree Details of delivery can be found in separate delivery note.  Patient had a postpartum course complicated by***. Patient is discharged home 06/19/23.  Newborn Data: Birth date:06/19/2023 Birth time:2:49 PM Gender:Female Living status:Living Apgars:8 ,9  Weight:2580 g  Magnesium Sulfate received: {Mag received:30440022} BMZ received: No Rhophylac:No MMR:Yes T-DaP:{Tdap:23962} Flu: Yes RSV Vaccine received: No Transfusion:{Transfusion received:30440034}  Immunizations received: Immunization History  Administered Date(s) Administered   Influenza,  Seasonal, Injecte, Preservative Fre 12/28/2022   Tdap 02/06/2021    Physical exam  Vitals:   06/19/23 1447 06/19/23 1502 06/19/23 1516 06/19/23 1531  BP: (!) 144/69 112/71 139/77 (!) 132/98  Pulse: 80 (!) 105 (!) 58 (!) 196  Resp:      Temp:      TempSrc:      SpO2:      Weight:      Height:       General: {Exam; general:21111117} Lochia: {Desc; appropriate/inappropriate:30686::"appropriate"} Uterine Fundus: {Desc; firm/soft:30687} Incision: {Exam; incision:21111123} DVT Evaluation: {Exam; dvt:2111122} Labs: Lab Results  Component Value Date   WBC 9.9 06/19/2023   HGB 10.1 (L) 06/19/2023   HCT 30.6 (L) 06/19/2023   MCV 103.0 (H) 06/19/2023   PLT 245 06/19/2023      Latest Ref Rng & Units 06/19/2023    1:55 AM  CMP  Glucose 70 - 99 mg/dL 83   BUN 6 - 20 mg/dL 6   Creatinine 6.06 - 3.01 mg/dL 6.01   Sodium 093 - 235 mmol/L 134   Potassium 3.5 - 5.1 mmol/L 3.2   Chloride 98 - 111 mmol/L 105   CO2 22 - 32 mmol/L 21   Calcium 8.9 - 10.3 mg/dL 8.8   Total Protein 6.5 - 8.1 g/dL 6.0   Total Bilirubin 0.0 - 1.2 mg/dL 0.5   Alkaline Phos 38 - 126 U/L 119   AST 15 - 41 U/L 11   ALT 0 - 44 U/L 8    Edinburgh Score:    05/01/2021    2:58 PM  Edinburgh Postnatal Depression Scale Screening Tool  I have been able to laugh and see the funny side of things. 0  I  have looked forward with enjoyment to things. 0  I have blamed myself unnecessarily when things went wrong. 1  I have been anxious or worried for no good reason. 0  I have felt scared or panicky for no good reason. 0  Things have been getting on top of me. 1  I have been so unhappy that I have had difficulty sleeping. 1  I have felt sad or miserable. 0  I have been so unhappy that I have been crying. 0  The thought of harming myself has occurred to me. 0  Edinburgh Postnatal Depression Scale Total 3   No data recorded  After visit meds:  Allergies as of 06/19/2023   No Known Allergies   Med Rec must be  completed prior to using this [facility]***        Discharge home in stable condition Infant Feeding: Bottle Infant Disposition:home with mother Discharge instruction: per After Visit Summary and Postpartum booklet. Activity: Advance as tolerated. Pelvic rest for 6 weeks.  Diet: routine diet Future Appointments:No future appointments. Follow up Visit:  Follow-up Information     Center for [facility] Follow up.   Specialty: Obstetrics and Gynecology Why: Routine postpartum follow up in 4-6 weeks Contact information: [address] [place] [address] [phone]               Message sent to [facility] 3/8  Please schedule this patient for a In person postpartum visit in 4 weeks with the following provider: Any provider. Additional Postpartum F/U:Postpartum Depression checkup and BP check 1 week  Low risk pregnancy complicated by: HTN Delivery mode:  Vaginal, Spontaneous Anticipated Birth Control:   Declined   06/19/2023 Wyn Forster, MD

## 2023-06-20 LAB — CBC
HCT: 26.6 % — ABNORMAL LOW (ref 36.0–46.0)
Hemoglobin: 8.9 g/dL — ABNORMAL LOW (ref 12.0–15.0)
MCH: 34.2 pg — ABNORMAL HIGH (ref 26.0–34.0)
MCHC: 33.5 g/dL (ref 30.0–36.0)
MCV: 102.3 fL — ABNORMAL HIGH (ref 80.0–100.0)
Platelets: 211 10*3/uL (ref 150–400)
RBC: 2.6 MIL/uL — ABNORMAL LOW (ref 3.87–5.11)
RDW: 12.6 % (ref 11.5–15.5)
WBC: 11.4 10*3/uL — ABNORMAL HIGH (ref 4.0–10.5)
nRBC: 0 % (ref 0.0–0.2)

## 2023-06-20 MED ORDER — ACETAMINOPHEN 325 MG PO TABS: 650.0000 mg | ORAL_TABLET | Freq: Four times a day (QID) | ORAL | 1 refills | Status: AC | PRN

## 2023-06-20 MED ORDER — POTASSIUM CHLORIDE CRYS ER 20 MEQ PO TBCR: 20.0000 meq | EXTENDED_RELEASE_TABLET | Freq: Every day | ORAL | 0 refills | Status: DC

## 2023-06-20 MED ORDER — FUROSEMIDE 40 MG PO TABS: 40.0000 mg | ORAL_TABLET | Freq: Every day | ORAL | 0 refills | Status: DC

## 2023-06-20 MED ORDER — IBUPROFEN 600 MG PO TABS: 600.0000 mg | ORAL_TABLET | Freq: Four times a day (QID) | ORAL | 1 refills | Status: AC | PRN

## 2023-06-20 MED ORDER — NIFEDIPINE ER OSMOTIC RELEASE 30 MG PO TB24
30.0000 mg | ORAL_TABLET | Freq: Two times a day (BID) | ORAL | Status: DC
  Administered 2023-06-20: 30 mg via ORAL
  Filled 2023-06-20: qty 1

## 2023-06-20 MED ORDER — NIFEDIPINE ER 60 MG PO TB24: 60.0000 mg | ORAL_TABLET | Freq: Every day | ORAL | 0 refills | Status: DC

## 2023-06-20 MED ORDER — SENNOSIDES-DOCUSATE SODIUM 8.6-50 MG PO TABS: 2.0000 | ORAL_TABLET | Freq: Every evening | ORAL | 1 refills | Status: DC | PRN

## 2023-06-20 NOTE — Anesthesia Postprocedure Evaluation (Signed)
 Anesthesia Post Note  Patient: Cindy Welch  Procedure(s) Performed: AN AD HOC LABOR EPIDURAL     Patient location during evaluation: Mother Baby Anesthesia Type: Epidural Level of consciousness: awake and alert Pain management: pain level controlled Vital Signs Assessment: post-procedure vital signs reviewed and stable Respiratory status: spontaneous breathing, nonlabored ventilation and respiratory function stable Cardiovascular status: stable Postop Assessment: no headache, no backache, epidural receding, no apparent nausea or vomiting, patient able to bend at knees, able to ambulate and adequate PO intake Anesthetic complications: no   No notable events documented.  Last Vitals:  Vitals:   06/20/23 1241 06/20/23 1545  BP: (!) 125/94 139/87  Pulse: 78 62  Resp: 17 18  Temp: 36.4 C (!) 36.4 C  SpO2: 100%     Last Pain:  Vitals:   06/20/23 1545  TempSrc: Oral  PainSc:    Pain Goal:                   [place]

## 2023-06-20 NOTE — Progress Notes (Signed)
 Pt stated she had  babyscripts app but never got a code. I put in babyscripts order enrollment for the [facility] Home Device but it did not show up in epic.  I enrolled this pt in the regular babyscripts app and the patient has not yet received the code to enroll. RN called Dorathy Kinsman Midwife who encouaged pt to call OB with BP 160/110. Additionally, pt is to call babyscripts number to enroll tomorrow morning. RN relayed info to the patient who stated she will call and follow up. Pt knows to follow up with OB in 3 days. RN reviewed signs and symptoms of preeclampsia and hypertension.

## 2023-06-20 NOTE — Progress Notes (Signed)
 Patient ID: Cindy Welch, female   DOB: November 22, 1995, 28 y.o.   MRN: [id number] Pt is PPD#1 w/ GHTN. She strongly desires D/C. BP's improving on Procardia. Has home BP cuff and is set up for [employer]. Requested for office to move up BP check to 3 days from now.   [place], [place] 06/20/2023 5:32 PM

## 2023-06-20 NOTE — Clinical Social Work Maternal (Addendum)
 CLINICAL SOCIAL WORK MATERNAL/CHILD NOTE  Patient Details  Name: Cindy Welch MRN: [id number] Date of Birth: [birth year]  Date:  06/20/2023  Clinical Social Worker Initiating Note:  Albertine Patricia, LCSWA Date/Time: Initiated:  06/20/23/1704     Child's Name:  Nadyne Coombes   Biological Parents:  Mother, Father (FOB: Deforest Hoyles, DOB: 11/10/1997)   Need for Interpreter:  None   Reason for Referral:  Current Substance Use/Substance Use During Pregnancy     Address:  [address] [place] [address]    Phone number:  [phone] (home)     Additional phone number:   Household Members/Support Persons (HM/SP):   Household Member/Support Person 1, Household Member/Support Person 2, Household Member/Support Person 3   HM/SP Name Relationship DOB or Age  HM/SP -1 Deforest Hoyles FOB 11/10/1997  HM/SP -2 Hoyt Koch MOB's mother    HM/SP -3 Ah'marie Bell Daughter 04/18/2021  HM/SP -4        HM/SP -5        HM/SP -6        HM/SP -7        HM/SP -8          Natural Supports (not living in the home):  Extended Family, Immediate Family   Professional Supports: None   Employment: Unemployed   Type of Work:     Education:  Engineer, agricultural   Homebound arranged:    Surveyor, quantity Resources:  Medicaid   Other Resources:  Sales executive  , WIC   Cultural/Religious Considerations Which May Impact Care:  Per chart review, MOB identifies as non-denominational  Strengths:  Ability to meet basic needs  , Home prepared for child  , Pediatrician chosen   Psychotropic Medications:         Pediatrician:    Armed forces operational officer area  Pediatrician List:   [facility] ([address] [employer])  [place]    [place]      Pediatrician Fax Number:    Risk Factors/Current Problems:  Substance Use     Cognitive State:  Able to Concentrate  , Linear Thinking  , Goal Oriented  , Alert      Mood/Affect:  Calm  , Comfortable  , Flat  , Interested     CSW Assessment: CSW was consulted due to maternal THC use during pregnancy. CSW met with MOB at bedside to complete assessment. When CSW entered room, MOB was observed sitting in chair, nursing infant. FOB was present sitting nearby. CSW introduced self requested to speak with MOB alone. MOB provided verbal consent to complete consult with FOB present. CSW explained reason for consult. MOB presented as calm with a flat affect, was agreeable to consult and remained engaged throughout encounter.   CSW confirmed demographic information on file.   CSW inquired about MOB's mental health history. MOB denied a history of mental health symptoms/diagnoses and denied a history of postpartum depression after the birth of her other daughter. MOB identified FOB, her parents, FOB's parents, and their siblings as supports. MOB denied current SI/HI.  CSW provided education regarding the baby blues period vs. perinatal mood disorders, discussed treatment and gave resources for mental health follow up if concerns arise.  CSW recommends self-evaluation during the postpartum time period using the New Mom Checklist from Postpartum Progress and encouraged MOB to contact a medical professional if symptoms are noted at any time.  MOB reports she has all needed items for infant, including a car seat and bassinet. CSW inquired about additional resource needs, MOB denied additional needs.  CSW informed MOB about hospital drug screen policy due to reported THC use during pregnancy. CSW explained that a CPS report would be made due to infant's UDS resulting positive for marijuana, and infant's CDS would continue to be monitored. MOB expressed understanding. CSW inquired about substance use during pregnancy. 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 CSW inquired the reason for marijuana use. MOB reported there was  no reason. FOB shared that MOB smoked marijuana to help with an appetite due to feeling nauseas while pregnant. CSW inquired about prior CPS involvement. MOB reports CPS involvement after the birth of her other daughter due to using marijuana during pregnancy and her daughter testing positive for marijuana at birth. MOB reports CPS completed a home visit and closed the case shortly after. MOB denied using other illicit substances during pregnancy. CSW inquired about transportation barriers. MOB denied transportation issues.  CSW provided review of Sudden Infant Death Syndrome (SIDS) precautions.    CSW placed call to [facility] Department of Social Services CPS line and made CPS report to social worker Johnathan Hausen due to infant's UDS resulting positive for THC.  CSW identifies no further need for intervention and no barriers to discharge at this time.   CSW Plan/Description:  Sudden Infant Death Syndrome (SIDS) Education, Perinatal Mood and Anxiety Disorder (PMADs) Education, Hospital Drug Screen Policy Information, Child Protective Service Report  , CSW Will Continue to Monitor Umbilical Cord Tissue Drug Screen Results and Make Report if Warranted, No Further Intervention Required/No Barriers to Discharge    Norberto Sorenson, LCSWA 06/20/2023, 5:07 PM

## 2023-06-21 ENCOUNTER — Telehealth: Payer: Self-pay | Admitting: *Deleted

## 2023-06-21 MED FILL — Oxytocin-Sodium Chloride 0.9% IV Soln 30 Unit/500ML: INTRAVENOUS | Qty: 500 | Status: AC

## 2023-06-21 NOTE — Telephone Encounter (Signed)
 I called patient and informed of need for appointment and scheduled for 3/ 12/25 afternoon per her preference.  Cindy Welch

## 2023-06-21 NOTE — Telephone Encounter (Signed)
-----   Message from [place] sent at 06/20/2023  5:30 PM EDT ----- Regarding: BP check Please move up BP check to 3 days.

## 2023-06-23 ENCOUNTER — Ambulatory Visit (INDEPENDENT_AMBULATORY_CARE_PROVIDER_SITE_OTHER): Admitting: *Deleted

## 2023-06-23 ENCOUNTER — Other Ambulatory Visit: Payer: Self-pay

## 2023-06-23 VITALS — BP 140/96 | HR 58 | Ht 65.5 in | Wt 172.8 lb

## 2023-06-23 DIAGNOSIS — Z0130 Encounter for examination of blood pressure without abnormal findings: Secondary | ICD-10-CM

## 2023-06-23 MED ORDER — NIFEDIPINE ER OSMOTIC RELEASE 90 MG PO TB24: 90.0000 mg | ORAL_TABLET | Freq: Every day | ORAL | 2 refills | Status: AC

## 2023-06-23 NOTE — Progress Notes (Signed)
 Pt presents for BP check following vaginal delivery on 06/19/23. Hx pertinent for IOL due to gestational HTN @ 38 wks. Today pt reports that she is not having H/A or visual disturbances.  She is taking Nifedipine 60 mg daily as prescribed. Pt is also still taking Lasix and Potassium chloride. BP - 140/96, P - 58.  Re-check BP - 137/98.  Consult w/Dr. Nobie Putnam who advises Nifedipine 90 mg daily. Pt was informed of dose change and new Rx e-prescribed. She was instructed to go to [facility] if she develops severe H/A, dizziness, blurry vision or seeing spots. Pt voiced understanding. Post partum visit scheduled on 4/23.

## 2023-06-23 NOTE — Addendum Note (Signed)
 Addended by: Jill Side on: 06/23/2023 02:45 PM   Modules accepted: Orders

## 2023-06-25 ENCOUNTER — Encounter: Payer: Medicaid Other | Admitting: Obstetrics and Gynecology

## 2023-07-02 ENCOUNTER — Encounter: Payer: Self-pay | Admitting: Physician Assistant

## 2023-07-08 ENCOUNTER — Other Ambulatory Visit: Payer: Medicaid Other

## 2023-07-08 ENCOUNTER — Encounter: Payer: Self-pay | Admitting: Family Medicine

## 2023-08-04 ENCOUNTER — Ambulatory Visit: Admitting: Obstetrics and Gynecology

## 2023-08-17 ENCOUNTER — Ambulatory Visit: Admitting: Certified Nurse Midwife

## 2023-08-17 ENCOUNTER — Other Ambulatory Visit: Payer: Self-pay

## 2023-08-17 DIAGNOSIS — Z789 Other specified health status: Secondary | ICD-10-CM

## 2023-08-17 DIAGNOSIS — Z8759 Personal history of other complications of pregnancy, childbirth and the puerperium: Secondary | ICD-10-CM

## 2023-08-17 NOTE — Progress Notes (Signed)
 Post Partum Visit Note  Cindy Welch is a 28 y.o. G34P2002 female who presents for a postpartum visit. She is 8 weeks postpartum following a normal spontaneous vaginal delivery.  I have fully reviewed the prenatal and intrapartum course. The delivery was at 38 gestational weeks.  Anesthesia: epidural. Postpartum course has been good. Baby is doing well. Baby is feeding by both breast and bottle - Similac Advance. Bleeding no bleeding. Bowel function is normal. Bladder function is normal. Patient is sexually active. Contraception method is condoms. Postpartum depression screening: negative.   Upstream - 08/17/23 1016       Pregnancy Intention Screening   Does the patient want to become pregnant in the next year? No    Does the patient's partner want to become pregnant in the next year? No    Would the patient like to discuss contraceptive options today? No      Contraception Wrap Up   Current Method Female Condom    Contraception Counseling Provided No            The pregnancy intention screening data noted above was reviewed. Potential methods of contraception were discussed. The patient elected to proceed with No data recorded.   Edinburgh Postnatal Depression Scale - 08/17/23 1012       Edinburgh Postnatal Depression Scale:  In the Past 7 Days   I have been able to laugh and see the funny side of things. 0    I have looked forward with enjoyment to things. 0    I have blamed myself unnecessarily when things went wrong. 0    I have been anxious or worried for no good reason. 0    I have felt scared or panicky for no good reason. 0    Things have been getting on top of me. 1    I have been so unhappy that I have had difficulty sleeping. 0    I have felt sad or miserable. 0    I have been so unhappy that I have been crying. 0    The thought of harming myself has occurred to me. 0    Edinburgh Postnatal Depression Scale Total 1             Health Maintenance Due   Topic Date Due   HPV VACCINES (1 - 3-dose series) Never done   Pneumococcal Vaccine 72-41 Years old (1 of 2 - PCV) Never done   COVID-19 Vaccine (1 - 2024-25 season) Never done   Cervical Cancer Screening (Pap smear)  10/03/2023    The following portions of the patient's history were reviewed and updated as appropriate: allergies, current medications, past family history, past medical history, past social history, past surgical history, and problem list.  Review of Systems Pertinent items are noted in HPI.  Objective:  BP 132/82   Pulse (!) 59   Wt 180 lb 3.2 oz (81.7 kg)   LMP 09/26/2022 (Exact Date)   Breastfeeding Yes   BMI 29.53 kg/m    General:  alert, cooperative, and appears stated age   Breasts:  Deferred d/t actively breast feeding   Lungs: clear to auscultation bilaterally  Heart:  regular rate and rhythm, S1, S2 normal, no murmur, click, rub or gallop  Abdomen: soft, non-tender; bowel sounds normal; no masses,  no organomegaly   Wound N/A   GU exam:   Deferred        Assessment:    1. Postpartum care and examination -  Patient doing well.   2. History of gestational hypertension - Currently stable on Procardia  daily. Encouraged to continue with current management regimen at this time    Normal postpartum exam.   Plan:   Essential components of care per ACOG recommendations:  1.  Mood and well being: Patient with negative depression screening today. Reviewed local resources for support.  - Patient tobacco use? No.   - hx of drug use? No.    2. Infant care and feeding:  -Patient currently breastmilk feeding? Yes. Discussed returning to work and pumping. Reviewed importance of draining breast regularly to support lactation. Also discussed signs of infant satiety versus visualized how much milk is pumped. Reviewed expectations for decreasing pumping times.  -Social determinants of health (SDOH) reviewed in EPIC. No concerns.   3. Sexuality, contraception  and birth spacing - Patient does not want a pregnancy in the next year.  Desired family size is 2 children.  - Reviewed reproductive life planning. Reviewed contraceptive methods based on pt preferences and effectiveness.  Patient desired Female Condom today.   - Discussed birth spacing of 18 months  4. Sleep and fatigue -Encouraged family/partner/community support of 4 hrs of uninterrupted sleep to help with mood and fatigue  5. Physical Recovery  - Discussed patients delivery and complications. She describes her labor as good. - Patient had a Vaginal, no problems at delivery. Patient had no laceration. Perineal healing reviewed. Patient expressed understanding - Patient has urinary incontinence? No. - Patient is safe to resume physical and sexual activity  6.  Health Maintenance - HM due items addressed Yes - Last pap smear  Diagnosis  Date Value Ref Range Status  10/02/2020   Final   - Negative for intraepithelial lesion or malignancy (NILM)   Pap smear not done at today's visit. Patient to return in June 2025 for PAP  -Breast Cancer screening indicated? No.   7. Chronic Disease/Pregnancy Condition follow up: Hypertension on Procardia  Daily.   - PCP follow up  Corie Diamond, CNM [facility], [facility]

## 2023-09-15 ENCOUNTER — Ambulatory Visit: Admitting: Certified Nurse Midwife

## 2023-12-15 ENCOUNTER — Emergency Department (HOSPITAL_COMMUNITY): Admission: EM | Admit: 2023-12-15 | Discharge: 2023-12-15 | Disposition: A | Discharge status: Home / Self Care

## 2023-12-15 ENCOUNTER — Other Ambulatory Visit: Payer: Self-pay

## 2023-12-15 DIAGNOSIS — Z7951 Long term (current) use of inhaled steroids: Secondary | ICD-10-CM | POA: Diagnosis not present

## 2023-12-15 DIAGNOSIS — R059 Cough, unspecified: Secondary | ICD-10-CM | POA: Diagnosis present

## 2023-12-15 DIAGNOSIS — J45901 Unspecified asthma with (acute) exacerbation: Secondary | ICD-10-CM | POA: Insufficient documentation

## 2023-12-15 DIAGNOSIS — B349 Viral infection, unspecified: Secondary | ICD-10-CM | POA: Diagnosis not present

## 2023-12-15 MED ORDER — PREDNISONE 20 MG PO TABS
60.0000 mg | ORAL_TABLET | Freq: Once | ORAL | Status: AC
  Administered 2023-12-15: 60 mg via ORAL
  Filled 2023-12-15: qty 3

## 2023-12-15 MED ORDER — PREDNISONE 10 MG PO TABS: 40.0000 mg | ORAL_TABLET | Freq: Every day | ORAL | 0 refills | Status: AC

## 2023-12-15 MED ORDER — ALBUTEROL SULFATE HFA 108 (90 BASE) MCG/ACT IN AERS
2.0000 | INHALATION_SPRAY | Freq: Once | RESPIRATORY_TRACT | Status: AC
  Administered 2023-12-15: 2 via RESPIRATORY_TRACT
  Filled 2023-12-15: qty 6.7

## 2023-12-15 NOTE — ED Triage Notes (Signed)
 Pt  brought in by EMS with [facility] since yesterday , HX of asthma but has ran out of rescue inhaler. Wheezing all fields 1 duoneb given by EMS on arrival. 94% on RA. 100% on duoneb 98% after Duonebs.  122/72 HR 110 CBG 122

## 2023-12-15 NOTE — ED Provider Notes (Signed)
 [place] EMERGENCY DEPARTMENT AT [facility] Provider Note   CSN: [id number] Arrival date & time: 12/15/23  1542     Patient presents with: Shortness of Breath (Started yesterday)   Cindy Welch is a 28 y.o. female.   28 year old female presents for evaluation of cough and shortness of breath.  She has a history of asthma and ran out of her inhaler yesterday.  States she has also had some congestion.  Denies any other symptoms or concerns at this time.   Shortness of Breath Associated symptoms: cough   Associated symptoms: no abdominal pain, no chest pain, no ear pain, no fever, no rash, no sore throat and no vomiting        Prior to Admission medications   Medication Sig Start Date End Date Taking? Authorizing Provider  predniSONE  (DELTASONE ) 10 MG tablet Take 4 tablets (40 mg total) by mouth daily for 4 days. 12/15/23 12/19/23 Yes Clio Gerhart L, DO  acetaminophen  (TYLENOL ) 325 MG tablet Take 2 tablets (650 mg total) by mouth every 6 (six) hours as needed (for pain scale < 4). 06/20/23   Nicholaus Almarie HERO, MD  albuterol  (VENTOLIN  HFA) 108 [phone] Base) MCG/ACT inhaler Inhale 1-2 puffs into the lungs every 6 (six) hours as needed for wheezing or shortness of breath. 06/17/23   Zina Jerilynn LABOR, MD  ibuprofen  (ADVIL ) 600 MG tablet Take 1 tablet (600 mg total) by mouth every 6 (six) hours as needed. 06/20/23   Nicholaus Almarie HERO, MD  NIFEdipine  (PROCARDIA  XL/NIFEDICAL-XL) 90 MG 24 hr tablet Take 1 tablet (90 mg total) by mouth daily. 06/23/23   Cresenzo, John V, MD  Prenatal Vit-Fe Fumarate-FA (MULTIVITAMIN-PRENATAL) 27-0.8 MG TABS tablet Take 1 tablet by mouth daily at 12 noon. Patient not taking: Reported on 08/17/2023    [provider]    Allergies: Patient has no known allergies.    Review of Systems  Constitutional:  Negative for chills and fever.  HENT:  Negative for ear pain and sore throat.   Eyes:  Negative for pain and visual disturbance.  Respiratory:   Positive for cough and shortness of breath.   Cardiovascular:  Negative for chest pain and palpitations.  Gastrointestinal:  Negative for abdominal pain and vomiting.  Genitourinary:  Negative for dysuria and hematuria.  Musculoskeletal:  Negative for arthralgias and back pain.  Skin:  Negative for color change and rash.  Neurological:  Negative for seizures and syncope.  All other systems reviewed and are negative.   Updated Vital Signs BP 122/75 (BP Location: Left Arm)   Pulse (!) 112   Temp 99.2 F (37.3 C) (Oral)   Ht 5' 5 (1.651 m)   Wt 85.7 kg   LMP 12/12/2023 (Exact Date)   SpO2 99%   Breastfeeding No   BMI 31.45 kg/m   Physical Exam Vitals and nursing note reviewed.  Constitutional:      General: She is not in acute distress.    Appearance: She is well-developed. She is not ill-appearing.  HENT:     Head: Normocephalic and atraumatic.  Eyes:     Conjunctiva/sclera: Conjunctivae normal.  Cardiovascular:     Rate and Rhythm: Normal rate and regular rhythm.     Heart sounds: No murmur heard. Pulmonary:     Effort: Pulmonary effort is normal. No respiratory distress.     Breath sounds: Wheezing present. No decreased breath sounds.     Comments: Very mild bilateral wheezing Abdominal:     Palpations:  Abdomen is soft.     Tenderness: There is no abdominal tenderness.  Musculoskeletal:        General: No swelling.     Cervical back: Neck supple.  Skin:    General: Skin is warm and dry.     Capillary Refill: Capillary refill takes less than 2 seconds.  Neurological:     Mental Status: She is alert.  Psychiatric:        Mood and Affect: Mood normal.     (all labs ordered are listed, but only abnormal results are displayed) Labs Reviewed - No data to display  EKG: None  Radiology: No results found.   Procedures   Medications Ordered in the ED  predniSONE  (DELTASONE ) tablet 60 mg (has no administration in time range)  albuterol  (VENTOLIN  HFA) 108 (90  Base) MCG/ACT inhaler 2 puff (has no administration in time range)                                    Medical Decision Making Patient here for asthma exacerbation and viral URI.  Vitals are stable.  She ran out of her inhaler yesterday.  I gave her 1 here to take home.  Also gave her a dose of steroids here and will treat her for an asthma exacerbation with 4 more days of steroids.  Advised over-the-counter medication as needed for her symptoms and follow-up with primary care doctor as needed.  Otherwise advised to return to the ER for new or worsening symptoms.  She feels comfortable being discharged home.  Problems Addressed: Moderate asthma with exacerbation, unspecified whether persistent: acute illness or injury Viral syndrome: acute illness or injury  Amount and/or Complexity of Data Reviewed External Data Reviewed: notes.    Details: Prior records reviewed and patient had a recent pregnancy with vaginal delivery in May of this year  Risk OTC drugs. Prescription drug management.     Final diagnoses:  Moderate asthma with exacerbation, unspecified whether persistent  Viral syndrome    ED Discharge Orders          Ordered    predniSONE  (DELTASONE ) 10 MG tablet  Daily        12/15/23 1554               Auryn Paige, Duwaine L, DO 12/15/23 1556

## 2023-12-15 NOTE — Discharge Instructions (Addendum)
 Use your inhaler as needed and take your steroids as prescribed.  You can use any other over-the-counter medications as needed for your cough and other viral symptoms.  Call and follow-up with your primary care doctor next week.  Return to the ER for new or worsening symptoms.

## 2023-12-15 NOTE — ED Notes (Signed)
 Pt given prednisone  and inhaler doses prior to leaving. Verbalized understanding of discharge instructions. PT called a ride home, asked to be taken to the lobby in a wheelchair.
